# Patient Record
Sex: Female | Born: 1975 | Race: Black or African American | Hispanic: No | Marital: Married | Smoking: Never smoker
Health system: Southern US, Community
[De-identification: ages and names within clinical notes are randomized; demographics above are authoritative.]

## PROBLEM LIST (undated history)

## (undated) DIAGNOSIS — R079 Chest pain, unspecified: Secondary | ICD-10-CM

## (undated) DIAGNOSIS — E785 Hyperlipidemia, unspecified: Secondary | ICD-10-CM

## (undated) DIAGNOSIS — E669 Obesity, unspecified: Secondary | ICD-10-CM

## (undated) DIAGNOSIS — M549 Dorsalgia, unspecified: Secondary | ICD-10-CM

## (undated) DIAGNOSIS — D649 Anemia, unspecified: Secondary | ICD-10-CM

## (undated) DIAGNOSIS — I10 Essential (primary) hypertension: Secondary | ICD-10-CM

## (undated) DIAGNOSIS — K219 Gastro-esophageal reflux disease without esophagitis: Secondary | ICD-10-CM

## (undated) DIAGNOSIS — F419 Anxiety disorder, unspecified: Secondary | ICD-10-CM

## (undated) HISTORY — PX: ENDOMETRIAL ABLATION: SHX621

## (undated) HISTORY — DX: Obesity, unspecified: E66.9

## (undated) HISTORY — DX: Essential (primary) hypertension: I10

## (undated) HISTORY — DX: Dorsalgia, unspecified: M54.9

## (undated) HISTORY — DX: Chest pain, unspecified: R07.9

## (undated) HISTORY — PX: TUBAL LIGATION: SHX77

## (undated) HISTORY — PX: BARIATRIC SURGERY: SHX1103

## (undated) HISTORY — DX: Hyperlipidemia, unspecified: E78.5

## (undated) HISTORY — DX: Anxiety disorder, unspecified: F41.9

## (undated) HISTORY — PX: SKIN SURGERY: SHX2413

## (undated) HISTORY — DX: Gastro-esophageal reflux disease without esophagitis: K21.9

## (undated) HISTORY — DX: Anemia, unspecified: D64.9

---

## 2020-06-18 ENCOUNTER — Ambulatory Visit (INDEPENDENT_AMBULATORY_CARE_PROVIDER_SITE_OTHER): Payer: BC Managed Care – PPO | Admitting: Family Medicine

## 2020-06-18 ENCOUNTER — Other Ambulatory Visit: Payer: Self-pay

## 2020-06-18 ENCOUNTER — Encounter (INDEPENDENT_AMBULATORY_CARE_PROVIDER_SITE_OTHER): Payer: Self-pay | Admitting: Family Medicine

## 2020-06-18 VITALS — BP 120/82 | HR 69 | Temp 97.9°F | Ht 62.0 in | Wt 174.0 lb

## 2020-06-18 DIAGNOSIS — R0683 Snoring: Secondary | ICD-10-CM

## 2020-06-18 DIAGNOSIS — E7849 Other hyperlipidemia: Secondary | ICD-10-CM

## 2020-06-18 DIAGNOSIS — F411 Generalized anxiety disorder: Secondary | ICD-10-CM

## 2020-06-18 DIAGNOSIS — Z1331 Encounter for screening for depression: Secondary | ICD-10-CM

## 2020-06-18 DIAGNOSIS — R0602 Shortness of breath: Secondary | ICD-10-CM | POA: Diagnosis not present

## 2020-06-18 DIAGNOSIS — Z0289 Encounter for other administrative examinations: Secondary | ICD-10-CM

## 2020-06-18 DIAGNOSIS — N951 Menopausal and female climacteric states: Secondary | ICD-10-CM

## 2020-06-18 DIAGNOSIS — Z9189 Other specified personal risk factors, not elsewhere classified: Secondary | ICD-10-CM | POA: Diagnosis not present

## 2020-06-18 DIAGNOSIS — Z9884 Bariatric surgery status: Secondary | ICD-10-CM

## 2020-06-18 DIAGNOSIS — E669 Obesity, unspecified: Secondary | ICD-10-CM

## 2020-06-18 DIAGNOSIS — R7301 Impaired fasting glucose: Secondary | ICD-10-CM

## 2020-06-18 DIAGNOSIS — Z6831 Body mass index (BMI) 31.0-31.9, adult: Secondary | ICD-10-CM

## 2020-06-18 DIAGNOSIS — I10 Essential (primary) hypertension: Secondary | ICD-10-CM

## 2020-06-18 DIAGNOSIS — R5383 Other fatigue: Secondary | ICD-10-CM

## 2020-06-19 LAB — TSH: TSH: 1.16 u[IU]/mL (ref 0.450–4.500)

## 2020-06-19 LAB — COMPREHENSIVE METABOLIC PANEL
ALT: 27 IU/L (ref 0–32)
AST: 23 IU/L (ref 0–40)
Albumin/Globulin Ratio: 1.7 (ref 1.2–2.2)
Albumin: 4.8 g/dL (ref 3.8–4.8)
Alkaline Phosphatase: 80 IU/L (ref 44–121)
BUN/Creatinine Ratio: 25 — ABNORMAL HIGH (ref 9–23)
BUN: 17 mg/dL (ref 6–24)
Bilirubin Total: 0.5 mg/dL (ref 0.0–1.2)
CO2: 21 mmol/L (ref 20–29)
Calcium: 9.8 mg/dL (ref 8.7–10.2)
Chloride: 102 mmol/L (ref 96–106)
Creatinine, Ser: 0.69 mg/dL (ref 0.57–1.00)
Globulin, Total: 2.9 g/dL (ref 1.5–4.5)
Glucose: 85 mg/dL (ref 65–99)
Potassium: 4.7 mmol/L (ref 3.5–5.2)
Sodium: 141 mmol/L (ref 134–144)
Total Protein: 7.7 g/dL (ref 6.0–8.5)
eGFR: 109 mL/min/{1.73_m2} (ref >59)

## 2020-06-19 LAB — HEMOGLOBIN A1C
Est. average glucose Bld gHb Est-mCnc: 108 mg/dL
Hgb A1c MFr Bld: 5.4 % (ref 4.8–5.6)

## 2020-06-19 LAB — CBC WITH DIFFERENTIAL/PLATELET
Basophils Absolute: 0 10*3/uL (ref 0.0–0.2)
Basos: 1 %
EOS (ABSOLUTE): 0.1 10*3/uL (ref 0.0–0.4)
Eos: 3 %
Hemoglobin: 14.1 g/dL (ref 11.1–15.9)
Immature Grans (Abs): 0 10*3/uL (ref 0.0–0.1)
Immature Granulocytes: 0 %
Lymphocytes Absolute: 1.1 10*3/uL (ref 0.7–3.1)
Lymphs: 34 %
MCH: 29.1 pg (ref 26.6–33.0)
MCHC: 33.3 g/dL (ref 31.5–35.7)
MCV: 87 fL (ref 79–97)
Monocytes Absolute: 0.4 10*3/uL (ref 0.1–0.9)
Monocytes: 11 %
Neutrophils Absolute: 1.7 10*3/uL (ref 1.4–7.0)
Neutrophils: 51 %
Platelets: 278 10*3/uL (ref 150–450)
RBC: 4.84 x10E6/uL (ref 3.77–5.28)
RDW: 13.4 % (ref 11.7–15.4)
WBC: 3.2 10*3/uL — ABNORMAL LOW (ref 3.4–10.8)

## 2020-06-19 LAB — T4, FREE: Free T4: 1.03 ng/dL (ref 0.82–1.77)

## 2020-06-19 LAB — LIPID PANEL
Chol/HDL Ratio: 4.4 ratio (ref 0.0–4.4)
Cholesterol, Total: 243 mg/dL — ABNORMAL HIGH (ref 100–199)
HDL: 55 mg/dL (ref >39)
LDL Chol Calc (NIH): 166 mg/dL — ABNORMAL HIGH (ref 0–99)
Triglycerides: 124 mg/dL (ref 0–149)
VLDL Cholesterol Cal: 22 mg/dL (ref 5–40)

## 2020-06-19 LAB — ANEMIA PANEL
Ferritin: 55 ng/mL (ref 15–150)
Folate, Hemolysate: 396 ng/mL
Folate, RBC: 936 ng/mL (ref >498)
Hematocrit: 42.3 % (ref 34.0–46.6)
Iron Saturation: 64 % — ABNORMAL HIGH (ref 15–55)
Iron: 165 ug/dL — ABNORMAL HIGH (ref 27–159)
Retic Ct Pct: 1.7 % (ref 0.6–2.6)
Total Iron Binding Capacity: 259 ug/dL (ref 250–450)
UIBC: 94 ug/dL — ABNORMAL LOW (ref 131–425)
Vitamin B-12: 506 pg/mL (ref 232–1245)

## 2020-06-19 LAB — FSH/LH
FSH: 78.3 m[IU]/mL
LH: 45.9 m[IU]/mL

## 2020-06-19 LAB — INSULIN, RANDOM: INSULIN: 23.4 u[IU]/mL (ref 2.6–24.9)

## 2020-06-19 LAB — SEDIMENTATION RATE: Sed Rate: 5 mm/hr (ref 0–32)

## 2020-06-19 LAB — VITAMIN D 25 HYDROXY (VIT D DEFICIENCY, FRACTURES): Vit D, 25-Hydroxy: 17.1 ng/mL — ABNORMAL LOW (ref 30.0–100.0)

## 2020-06-19 LAB — C-REACTIVE PROTEIN: CRP: 3 mg/L (ref 0–10)

## 2020-06-26 NOTE — Progress Notes (Addendum)
Chief Complaint:   OBESITY Natasha Horn (MR# [id number]) is a 45 y.o. female who presents for evaluation and treatment of obesity and related comorbidities. Current BMI is Body mass index is 31.83 kg/m. Natasha Horn has been struggling with her weight for many years and has been unsuccessful in either losing weight, maintaining weight loss, or reaching her healthy weight goal.  Natasha Horn is currently in the action stage of change and ready to dedicate time achieving and maintaining a healthier weight. Natasha Horn is interested in becoming our patient and working on intensive lifestyle modifications including (but not limited to) diet and exercise for weight loss.  Natasha Horn says she has a big wedding in September.  History of taking Saxenda (GI issues).  History of gastric balloon and liposuction.  Her husband is on Category 3.  [name]'s habits were reviewed today and are as follows: she thinks her family will eat healthier with her, her desired weight loss is 43 pounds, she started gaining weight after having children, her heaviest weight ever was 180 pounds, she craves Gushers, cookies, meatloaf, and macaroni and cheese, she is frequently drinking liquids with calories, she frequently makes poor food choices, she has problems with excessive hunger, she frequently eats larger portions than normal and she struggles with emotional eating.  Depression Screen Natasha Horn's Food and Mood (modified PHQ-9) score was 9.  Depression screen Natasha Horn 2/9 06/18/2020  Decreased Interest 0  Down, Depressed, Hopeless 0  PHQ - 2 Score 0  Altered sleeping 3  Tired, decreased energy 3  Change in appetite 1  Feeling bad or failure about yourself  0  Trouble concentrating 2  Moving slowly or fidgety/restless 0  Suicidal thoughts 0  PHQ-9 Score 9  Difficult doing work/chores Somewhat difficult   Assessment/Plan:   1. Other fatigue [name] denies daytime somnolence and reports waking up still tired. Patent has a  history of symptoms of morning fatigue, morning headache and snoring. Natasha Horn generally gets 5 or 6 hours of sleep per night, and states that she has poor quality sleep. Snoring is present. Apneic episodes are not present. Epworth Sleepiness Score is 6.  Natasha Horn does feel that her weight is causing her energy to be lower than it should be. Fatigue may be related to obesity, depression or many other causes. Labs will be ordered, and in the meanwhile, Natasha Horn will focus on self care including making healthy food choices, increasing physical activity and focusing on stress reduction.  - EKG 12-Lead - Anemia panel - VITAMIN D 25 Hydroxy (Vit-D Deficiency, Fractures) - TSH - T4, free  2. SOB (shortness of breath) on exertion Natasha Horn notes increasing shortness of breath with exercising and seems to be worsening over time with weight gain. She notes getting out of breath sooner with activity than she used to. This has gotten worse recently. Natasha Horn denies shortness of breath at rest or orthopnea.  Natasha Horn does feel that she gets out of breath more easily that she used to when she exercises. Natasha Horn's shortness of breath appears to be obesity related and exercise induced. She has agreed to work on weight loss and gradually increase exercise to treat her exercise induced shortness of breath. Will continue to monitor closely.  3. Essential hypertension At goal. Medications: amlodipine-benazepril 10-20 mg daily, metoprolol 25 mg daily.   Plan: Avoid buying foods that are: processed, frozen, or prepackaged to avoid excess salt. We will continue to monitor closely alongside her PCP and/or Specialist.    BP Readings from Last  3 Encounters:  06/18/20 120/82   - Refill amLODipine-benazepril (LOTREL) 10-20 MG capsule; Take 1 capsule by mouth daily. - Refill metoprolol succinate (TOPROL-XL) 25 MG 24 hr tablet; Take 25 mg by mouth daily. - CBC with Differential/Platelet - Comprehensive metabolic panel -  TSH - T4, free  4. Other hyperlipidemia Course: Not at goal. Lipid-lowering medications: None.   Plan: Dietary changes: Increase soluble fiber, decrease simple carbohydrates, decrease saturated fat. Exercise changes: Moderate to vigorous-intensity aerobic activity 150 minutes per week or as tolerated. We will continue to monitor along with PCP/specialists as it pertains to her weight loss journey.  Will check FLP today.  The 10-year ASCVD risk score Natasha Horn DC Natasha Horn., et al., 2013) is: 2%   Values used to calculate the score:     Age: 28 years     Sex: Female     Is Non-Hispanic African American: Yes     Diabetic: No     Tobacco smoker: No     Systolic Blood Pressure: 678 mmHg     Is BP treated: Yes     HDL Cholesterol: 55 mg/dL     Total Cholesterol: 243 mg/dL  - Lipid panel  5. History of weight loss surgery Gastric balloon through [facility] in 2018.    6. Snores Natasha Horn endorses snoring.  She also has morning headaches.  Epworth Sleepiness Score is 6.  Plan:  Referral to Sleep Medicine placed today.  - Ambulatory referral to Sleep Studies  7. Perimenopausal vasomotor symptoms Women with obesity may experience more severe vasomotor symptoms at menopause than women with overweight or normal weight.    - FSH/LH  8. Lower extremity weakness She says that standing for several minutes causes tremors in her knees along with low back pain.  This has been occurring for years. Plan:  Will place referral to Sports Medicine today.  - C-reactive protein - Sed Rate (ESR) - Ambulatory referral to Sports Medicine  9. Fasting hyperglycemia Will check A1c and insulin level today, as per below.  - Hemoglobin A1c - Insulin, random  10. GAD (generalized anxiety disorder) Natasha Horn is taking Celexa 20 mg daily.  She eats when stressed, angry, and to stay awake.  Plan:  Continue Celexa.  Will refill today.  Behavior modification techniques were discussed today to help Natasha Horn deal  with her anxiety.    - Refill citalopram (CELEXA) 20 MG tablet; Take 20 mg by mouth daily.  11. Depression screening Shams was screened for depression as part of her new patient workup.  Her MODIFIED PHQ-9 is 9.  12. At risk for heart disease Due to Axel's current state of health and medical condition(s), she is at a higher risk for heart disease.  This puts the patient at much greater risk to subsequently develop cardiopulmonary conditions that can significantly affect patient's quality of life in a negative manner.    At least 8 minutes were spent on counseling Shar about these concerns today. Evidence-based interventions for health behavior change were utilized today including the discussion of self monitoring techniques, problem-solving barriers, and SMART goal setting techniques.  Specifically, regarding patient's less desirable eating habits and patterns, we employed the technique of small changes when Yiselle has not been able to fully commit to her prudent nutritional plan.  13. Class 1 obesity with serious comorbidity and body mass index (BMI) of 31.0 to 31.9 in adult, unspecified obesity type  Leylah is currently in the action stage of change and her goal is  to continue with weight loss efforts. I recommend Ashleyann begin the structured treatment plan as follows:  She has agreed to the Category 1 Plan.  Exercise goals: No exercise has been prescribed at this time.   Behavioral modification strategies: increasing lean protein intake, decreasing simple carbohydrates, increasing vegetables, increasing water intake, decreasing liquid calories, decreasing alcohol intake, decreasing sodium intake and increasing high fiber foods.  She was informed of the importance of frequent follow-up visits to maximize her success with intensive lifestyle modifications for her multiple health conditions. She was informed we would discuss her lab results at her next visit unless there is a critical  issue that needs to be addressed sooner. Rosell agreed to keep her next visit at the agreed upon time to discuss these results.  Objective:   Blood pressure 120/82, pulse 69, temperature 97.9 F (36.6 C), temperature source Oral, height $RemoveBefo'5\' 2"'[id number]$  (1.575 m), weight 174 lb (78.9 kg), last menstrual period 05/04/2020, SpO2 99 %. Body mass index is 31.83 kg/m.  EKG: Normal sinus rhythm, rate 70 bpm.  Indirect Calorimeter completed today shows a VO2 of 194 and a REE of 1352.  Her calculated basal metabolic rate is 9390 thus her basal metabolic rate is worse than expected.  General: Cooperative, alert, well developed, in no acute distress. HEENT: Conjunctivae and lids unremarkable. Cardiovascular: Regular rhythm.  Lungs: Normal work of breathing. Neurologic: No focal deficits.   Attestation Statements:   This is the patient's first visit at [facility]. The patient's NEW PATIENT PACKET was reviewed at length. Included in the packet: current and past health history, medications, allergies, ROS, gynecologic history (women only), surgical history, family history, social history, weight history, weight loss surgery history (for those that have had weight loss surgery), nutritional evaluation, mood and food questionnaire, PHQ9, Epworth questionnaire, sleep habits questionnaire, patient life and health improvement goals questionnaire. These will all be scanned into the patient's chart under media.   During the visit, I independently reviewed the patient's EKG, bioimpedance scale results, and indirect calorimeter results. I used this information to tailor a meal plan for the patient that will help her to lose weight and will improve her obesity-related conditions going forward. I performed a medically necessary appropriate examination and/or evaluation. I discussed the assessment and treatment plan with the patient. The patient was provided an opportunity to ask questions and all were answered.  The patient agreed with the plan and demonstrated an understanding of the instructions. Labs were ordered at this visit and will be reviewed at the next visit unless more critical results need to be addressed immediately. Clinical information was updated and documented in the EMR.   I, Water quality scientist, CMA, am acting as transcriptionist for Briscoe Deutscher, DO  I have reviewed the above documentation for accuracy and completeness, and I agree with the above. Briscoe Deutscher, DO

## 2020-07-01 NOTE — Progress Notes (Signed)
I, Peterson Lombard, LAT, ATC acting as a scribe for Lynne Leader, MD.  Subjective:    I'm seeing this patient as a consultation for Briscoe Deutscher, DO. Note will be routed back to referring provider/PCP.  CC: Low back pain  HPI: Pt is a 45 y/o females c/o chronic low back pain . Pt reports that upon standing for several minutes she get tremors in her knees and low back pain. Pt locates pain to her lower back.  This is been ongoing for years.  She has brought this to the attention of her primary care provider years ago.  Most recently she addressed this with Dr. Juleen China who obtained labs and referred her to me.  She states that her leg/knee tremors have been going on for approximately 2 years.  These tremors are worse w/ prolonged standing or walking, particularly on hard floors.  She's tried to do some knee/quad strengthening exercises w/ little to no change.  Radiating pain: No LE numbness/tingling: No Aggravating factors: housework; walking on hard floors for a prolonged period of time  Dx testing: 06/18/20   Past medical history, Surgical history, Family history, Social history, Allergies, and medications have been entered into the medical record, reviewed.   Review of Systems: No new headache, visual changes, nausea, vomiting, diarrhea, constipation, dizziness, abdominal pain, skin rash, fevers, chills, night sweats, weight loss, swollen lymph nodes, body aches, joint swelling, muscle aches, chest pain, shortness of breath, mood changes, visual or auditory hallucinations.   Objective:    Vitals:   07/02/20 1356  BP: 112/78  Pulse: 77  SpO2: 98%   General: Well Developed, well nourished, and in no acute distress.  Neuro/Psych: Alert and oriented x3, extra-ocular muscles intact, able to move all 4 extremities, sensation grossly intact. Skin: Warm and dry, no rashes noted.  Respiratory: Not using accessory muscles, speaking in full sentences, trachea midline.  Cardiovascular: Pulses  palpable, no extremity edema. Abdomen: Does not appear distended. MSK: L-spine normal. Nontender midline. Range of motion lacks full extension producing pain.  Otherwise normal range of motion. Lower extremity strength reflexes and sensation are equal normal throughout.  Bilateral  However with standing after approximately 45 seconds patient begins to have shaking of her knees.  This is improved with a bit of lumbar flexion.  Lab and Radiology Results  X-ray images L-spine obtained today personally and independently interpreted Lumbar luxation of sacrum.  Facet DJD.  Mild DDD.  Concern for spinal stenosis. Await formal radiology review  Recent Results (from the past 2160 hour(s))  Anemia panel     Status: Abnormal   Collection Time: 06/18/20 12:25 PM  Result Value Ref Range   Total Iron Binding Capacity 259 250 - 450 ug/dL   UIBC 94 (L) 131 - 425 ug/dL   Iron 165 (H) 27 - 159 ug/dL   Iron Saturation 64 (H) 15 - 55 %   Vitamin B-12 506 232 - 1,245 pg/mL   Folate, Hemolysate 396.0 Not Estab. ng/mL   Hematocrit 42.3 34.0 - 46.6 %   Folate, RBC 936 >498 ng/mL   Ferritin 55 15 - 150 ng/mL   Retic Ct Pct 1.7 0.6 - 2.6 %  CBC with Differential/Platelet     Status: Abnormal   Collection Time: 06/18/20 12:25 PM  Result Value Ref Range   WBC 3.2 (L) 3.4 - 10.8 x10E3/uL   RBC 4.84 3.77 - 5.28 x10E6/uL   Hemoglobin 14.1 11.1 - 15.9 g/dL   MCV 87 79 - 97  fL   MCH 29.1 26.6 - 33.0 pg   MCHC 33.3 31.5 - 35.7 g/dL   RDW 13.4 11.7 - 15.4 %   Platelets 278 150 - 450 x10E3/uL   Neutrophils 51 Not Estab. %   Lymphs 34 Not Estab. %   Monocytes 11 Not Estab. %   Eos 3 Not Estab. %   Basos 1 Not Estab. %   Neutrophils Absolute 1.7 1.4 - 7.0 x10E3/uL   Lymphocytes Absolute 1.1 0.7 - 3.1 x10E3/uL   Monocytes Absolute 0.4 0.1 - 0.9 x10E3/uL   EOS (ABSOLUTE) 0.1 0.0 - 0.4 x10E3/uL   Basophils Absolute 0.0 0.0 - 0.2 x10E3/uL   Immature Granulocytes 0 Not Estab. %   Immature Grans (Abs) 0.0 0.0  - 0.1 x10E3/uL  Comprehensive metabolic panel     Status: Abnormal   Collection Time: 06/18/20 12:25 PM  Result Value Ref Range   Glucose 85 65 - 99 mg/dL   BUN 17 6 - 24 mg/dL   Creatinine, Ser 0.69 0.57 - 1.00 mg/dL   eGFR 109 >59 mL/min/1.73   BUN/Creatinine Ratio 25 (H) 9 - 23   Sodium 141 134 - 144 mmol/L   Potassium 4.7 3.5 - 5.2 mmol/L   Chloride 102 96 - 106 mmol/L   CO2 21 20 - 29 mmol/L   Calcium 9.8 8.7 - 10.2 mg/dL   Total Protein 7.7 6.0 - 8.5 g/dL   Albumin 4.8 3.8 - 4.8 g/dL   Globulin, Total 2.9 1.5 - 4.5 g/dL   Albumin/Globulin Ratio 1.7 1.2 - 2.2   Bilirubin Total 0.5 0.0 - 1.2 mg/dL   Alkaline Phosphatase 80 44 - 121 IU/L   AST 23 0 - 40 IU/L   ALT 27 0 - 32 IU/L  Lipid panel     Status: Abnormal   Collection Time: 06/18/20 12:25 PM  Result Value Ref Range   Cholesterol, Total 243 (H) 100 - 199 mg/dL   Triglycerides 124 0 - 149 mg/dL   HDL 55 >39 mg/dL   VLDL Cholesterol Cal 22 5 - 40 mg/dL   LDL Chol Calc (NIH) 166 (H) 0 - 99 mg/dL   Chol/HDL Ratio 4.4 0.0 - 4.4 ratio    Comment:                                   T. Chol/HDL Ratio                                             Men  Women                               1/2 Avg.Risk  3.4    3.3                                   Avg.Risk  5.0    4.4                                2X Avg.Risk  9.6    7.1  3X Avg.Risk 23.4   11.0   Hemoglobin A1c     Status: None   Collection Time: 06/18/20 12:25 PM  Result Value Ref Range   Hgb A1c MFr Bld 5.4 4.8 - 5.6 %    Comment:          Prediabetes: 5.7 - 6.4          Diabetes: >6.4          Glycemic control for adults with diabetes: <7.0    Est. average glucose Bld gHb Est-mCnc 108 mg/dL  Insulin, random     Status: None   Collection Time: 06/18/20 12:25 PM  Result Value Ref Range   INSULIN 23.4 2.6 - 24.9 uIU/mL  VITAMIN D 25 Hydroxy (Vit-D Deficiency, Fractures)     Status: Abnormal   Collection Time: 06/18/20 12:25 PM  Result  Value Ref Range   Vit D, 25-Hydroxy 17.1 (L) 30.0 - 100.0 ng/mL    Comment: Vitamin D deficiency has been defined by the [place] and an Endocrine Society practice guideline as a level of serum 25-OH vitamin D less than 20 ng/mL (1,2). The Endocrine Society went on to further define vitamin D insufficiency as a level between 21 and 29 ng/mL (2). 1. IOM (Institute of Medicine). 2010. Dietary reference    intakes for calcium and D. [place]: The    [employer]. 2. Holick MF, Binkley Fenton, Bischoff-Ferrari HA, et al.    Evaluation, treatment, and prevention of vitamin D    deficiency: an Endocrine Society clinical practice    guideline. JCEM. 2011 Jul; 96(7):1911-30.   TSH     Status: None   Collection Time: 06/18/20 12:25 PM  Result Value Ref Range   TSH 1.160 0.450 - 4.500 uIU/mL  T4, free     Status: None   Collection Time: 06/18/20 12:25 PM  Result Value Ref Range   Free T4 1.03 0.82 - 1.77 ng/dL  FSH/LH     Status: None   Collection Time: 06/18/20 12:25 PM  Result Value Ref Range   LH 45.9 mIU/mL    Comment:                     Adult Female:                       Follicular phase      2.4 -  12.6                       Ovulation phase      14.0 -  95.6                       Luteal phase          1.0 -  11.4                       Postmenopausal        7.7 -  58.5    FSH 78.3 mIU/mL    Comment:                     Adult Female:                       Follicular phase      3.5 -  12.5  Ovulation phase       4.7 -  21.5                       Luteal phase          1.7 -   7.7                       Postmenopausal       25.8 - 134.8   Sedimentation rate     Status: None   Collection Time: 06/18/20 12:25 PM  Result Value Ref Range   Sed Rate 5 0 - 32 mm/hr  C-reactive protein     Status: None   Collection Time: 06/18/20 12:25 PM  Result Value Ref Range   CRP 3 0 - 10 mg/L     Impression and Recommendations:    Assessment  and Plan: 45 y.o. female with knee shaking with standing.  This is improved with walking which causes lumbar flexion.  I believe patient is experiencing spinal stenosis with an unusual symptom.  With her back flexed she has excellent strength however I think she is experiencing some weakness with a bit of extension.  Discussed options.  Plan for MRI to further characterize spinal stenosis pattern and lateralization.  This will help dictate physical therapy versus injection versus surgery.  PDMP not reviewed this encounter. Orders Placed This Encounter  Procedures  . DG Lumbar Spine Complete    Standing Status:   Future    Number of Occurrences:   1    Standing Expiration Date:   07/02/2021    Order Specific Question:   Reason for Exam (SYMPTOM  OR DIAGNOSIS REQUIRED)    Answer:   eval poss lumbar spinal stenosis    Order Specific Question:   Is patient pregnant?    Answer:   No    Order Specific Question:   Preferred imaging location?    Answer:   [facility]   No orders of the defined types were placed in this encounter.   Discussed warning signs or symptoms. Please see discharge instructions. Patient expresses understanding.   The above documentation has been reviewed and is accurate and complete Lynne Leader, M.D.

## 2020-07-02 ENCOUNTER — Ambulatory Visit: Payer: BC Managed Care – PPO | Admitting: Family Medicine

## 2020-07-02 ENCOUNTER — Encounter: Payer: Self-pay | Admitting: Family Medicine

## 2020-07-02 ENCOUNTER — Ambulatory Visit (INDEPENDENT_AMBULATORY_CARE_PROVIDER_SITE_OTHER): Payer: BC Managed Care – PPO

## 2020-07-02 ENCOUNTER — Other Ambulatory Visit: Payer: Self-pay

## 2020-07-02 ENCOUNTER — Ambulatory Visit (INDEPENDENT_AMBULATORY_CARE_PROVIDER_SITE_OTHER): Payer: BC Managed Care – PPO | Admitting: Family Medicine

## 2020-07-02 ENCOUNTER — Encounter (INDEPENDENT_AMBULATORY_CARE_PROVIDER_SITE_OTHER): Payer: Self-pay | Admitting: Family Medicine

## 2020-07-02 VITALS — BP 112/78 | HR 77 | Ht 62.0 in | Wt 174.8 lb

## 2020-07-02 VITALS — BP 122/77 | HR 78 | Temp 97.9°F | Ht 62.0 in | Wt 169.0 lb

## 2020-07-02 DIAGNOSIS — M5441 Lumbago with sciatica, right side: Secondary | ICD-10-CM

## 2020-07-02 DIAGNOSIS — M5442 Lumbago with sciatica, left side: Secondary | ICD-10-CM

## 2020-07-02 DIAGNOSIS — G8929 Other chronic pain: Secondary | ICD-10-CM

## 2020-07-02 DIAGNOSIS — E7800 Pure hypercholesterolemia, unspecified: Secondary | ICD-10-CM

## 2020-07-02 DIAGNOSIS — Z780 Asymptomatic menopausal state: Secondary | ICD-10-CM

## 2020-07-02 DIAGNOSIS — R29898 Other symptoms and signs involving the musculoskeletal system: Secondary | ICD-10-CM

## 2020-07-02 DIAGNOSIS — E559 Vitamin D deficiency, unspecified: Secondary | ICD-10-CM

## 2020-07-02 DIAGNOSIS — R251 Tremor, unspecified: Secondary | ICD-10-CM

## 2020-07-02 DIAGNOSIS — I10 Essential (primary) hypertension: Secondary | ICD-10-CM

## 2020-07-02 DIAGNOSIS — E8881 Metabolic syndrome: Secondary | ICD-10-CM | POA: Diagnosis not present

## 2020-07-02 DIAGNOSIS — Z9189 Other specified personal risk factors, not elsewhere classified: Secondary | ICD-10-CM | POA: Diagnosis not present

## 2020-07-02 DIAGNOSIS — E669 Obesity, unspecified: Secondary | ICD-10-CM

## 2020-07-02 DIAGNOSIS — Z6831 Body mass index (BMI) 31.0-31.9, adult: Secondary | ICD-10-CM

## 2020-07-02 MED ORDER — VITAMIN D (ERGOCALCIFEROL) 1.25 MG (50000 UNIT) PO CAPS: 50000.0000 [IU] | ORAL_CAPSULE | ORAL | 0 refills | Status: DC

## 2020-07-02 NOTE — Patient Instructions (Signed)
Thank you for coming in today.  Please get an Xray today before you leave  I will keep you updated.   Based on xray may proceed to MRI or PT or both.    R[employer] neurological surgery (7th ed., pp. [phone]). [place], [place]: Elsevier."> [employer] neurological surgery (7th ed., pp. [phone]). [place], [place]: Elsevier.">  Spinal Stenosis  Spinal stenosis is a condition that happens when the spinal canal narrows. The spinal canal is the space between the bones of your spine (vertebrae). This narrowing puts pressure on the spinal cord or nerves. Spinal stenosis can affect the vertebrae in the neck, upper back, and lower back. This condition can range from mild to severe. In some cases, there are no symptoms. What are the causes? This condition is caused by areas of bone pushing into the spinal canal. This condition may be present at birth (congenital), or it may be caused by:  Slow breakdown of your vertebrae (spinal degeneration). This usually starts around 46 years of age.  Injury (trauma) to your spine.  Tumors in your spine.  Calcium deposits in your spine. What increases the risk? The following factors may make you more likely to develop this condition:  Being older than age 74.  Having a problem present at birth with an abnormally shaped spine (congenitalspinal deformity), such as scoliosis.  Having arthritis. What are the signs or symptoms? Symptoms of this condition include:  Pain in the neck or back that is generally worse with activities, particularly when you stand or walk.  Numbness, tingling, hot or cold sensations, weakness, or tiredness (fatigue) in your leg or legs.  Pain going from the buttock, down the thigh, and to the calf (sciatica). This can happen in one or both legs.  Frequent episodes of falling.  A foot-slapping gait that leads to muscle weakness. In more severe cases, you may develop:  Problems having a bowel movement or  urinating.  Difficulty having sex.  Loss of feeling in your legs and inability to walk. Symptoms may come on slowly and get worse over time. In some cases, there are no symptoms. How is this diagnosed? This condition is diagnosed based on your medical history and a physical exam. You will also have tests, such as an MRI, a CT scan, or an X-ray. How is this treated? Treatment for this condition often focuses on managing your pain and any other symptoms. Treatment may include:  Practicing good posture to lessen pressure on your nerves.  Exercising to strengthen muscles, build endurance, improve balance, and maintain range of motion. This may include physical therapy to restore movement and strength to your back.  Losing weight, if needed.  Medicines to reduce inflammation or pain. This may include a medicine that is injected into your spine (steroidinjection).  Assistive devices, such as a corset or brace. In some cases, surgery may be needed. The most common procedure is decompression laminectomy. This is done to remove excess bone that puts pressure on your nerve roots. Follow these instructions at home: Managing pain, stiffness, and swelling  Practice good posture. If you were given a brace or a corset, wear it as told by your health care provider.  Maintain a healthy weight. Talk with your health care provider if you need help losing weight.  If directed, apply heat to the affected area as often as told by your health care provider. Use the heat source that your health care provider recommends, such as a moist heat pack or  a heating pad. ? Place a towel between your skin and the heat source. ? Leave the heat on for 20-30 minutes. ? Remove the heat if your skin turns bright red. This is especially important if you are unable to feel pain, heat, or cold. You may have a greater risk of getting burned.   Activity  Do all exercises and stretches as told by your health care  provider.  Do not do any activities that cause pain. Ask your health care provider what activities are safe for you.  Do not lift anything that is heavier than 10 lb (4.5 kg), or the limit that you are told by your health care provider.  Return to your normal activities as told by your health care provider. Ask your health care provider what activities are safe for you. General instructions  Take over-the-counter and prescription medicines only as told by your health care provider.  Do not use any products that contain nicotine or tobacco, such as cigarettes, e-cigarettes, and chewing tobacco. If you need help quitting, ask your health care provider.  Eat a healthy diet. This includes plenty of fruits and vegetables, whole grains, and low-fat (lean) protein.  Keep all follow-up visits as told by your health care provider. This is important. Contact a health care provider if:  Your symptoms do not get better or they get worse.  You have a fever. Get help right away if:  You have new pain or symptoms of severe pain, such as: ? New or worsening pain in your neck or upper back. ? Severe pain that cannot be controlled with medicines. ? A severe headache that gets worse when you stand.  You are dizzy.  You have vision problems, such as blurred vision or double vision.  You have nausea or you vomit.  You develop new or worsening numbness or tingling in your back or legs.  You have pain, redness, swelling, or warmth in your arm or leg. Summary  Spinal stenosis is a condition that happens when the spinal canal narrows. The spinal canal is the space between the bones of your spine (vertebrae). This narrowing puts pressure on the spinal cord or nerves.  This condition may be caused by a birth defect, breakdown of your vertebrae, trauma, tumors, or calcium deposits.  Spinal stenosis can cause numbness, weakness, or pain in the buttocks, neck, back, and legs.  This condition is  usually diagnosed with your medical history, a physical exam, and tests, such as an MRI, a CT scan, or an X-ray. This information is not intended to replace advice given to you by your health care provider. Make sure you discuss any questions you have with your health care provider. Document Revised: 01/12/2019 Document Reviewed: 01/12/2019 Elsevier Patient Education  2021 [employer].

## 2020-07-02 NOTE — Progress Notes (Signed)
Chief Complaint:   OBESITY Natasha Horn is here to discuss her progress with her obesity treatment plan along with follow-up of her obesity related diagnoses.   Today's visit was #: 2 Starting weight: 174 lbs Starting date: 06/18/2020 Today's weight: 169 lbs Today's date: 07/02/2020 Total lbs lost to date: 5 lbs Body mass index is 30.91 kg/m.  Total weight loss percentage to date: -3.98%  Interim History: Natasha Horn is positive for polyphagia. Her eating window is between the hours of 11 am-8 pm. She prefers not to take medications to assist with weight loss.  Current Meal Plan: the Category 1 Plan for 98% of the time.  Current Exercise Plan: None at this time.  Assessment/Plan:   1. Pure hypercholesterolemia Course: Not at goal. Lipid-lowering medications: None.   Plan: Dietary changes: Increase soluble fiber, decrease simple carbohydrates, decrease saturated fat. Exercise changes: Moderate to vigorous-intensity aerobic activity 150 minutes per week or as tolerated. We will continue to monitor along with PCP/specialists as it pertains to her weight loss journey.  Lab Results  Component Value Date   CHOL 243 (H) 06/18/2020   HDL 55 06/18/2020   LDLCALC 166 (H) 06/18/2020   TRIG 124 06/18/2020   CHOLHDL 4.4 06/18/2020   Lab Results  Component Value Date   ALT 27 06/18/2020   AST 23 06/18/2020   ALKPHOS 80 06/18/2020   BILITOT 0.5 06/18/2020   The 10-year ASCVD risk score Denman George DC Jr., et al., 2013) is: 1.5%   Values used to calculate the score:     Age: 6 years     Sex: Female     Is Non-Hispanic African American: Yes     Diabetic: No     Tobacco smoker: No     Systolic Blood Pressure: 112 mmHg     Is BP treated: Yes     HDL Cholesterol: 55 mg/dL     Total Cholesterol: 243 mg/dL  2. Insulin resistance At goal. Goal is HgbA1c < 5.7, fasting insulin closer to 5.  Medication: None.    Plan:  She will continue to focus on protein-rich, low simple carbohydrate foods.  We reviewed the importance of hydration, regular exercise for stress reduction, and restorative sleep.   Lab Results  Component Value Date   HGBA1C 5.4 06/18/2020   Lab Results  Component Value Date   INSULIN 23.4 06/18/2020    3. Vitamin D deficiency Current vitamin D is 17.1, tested on 06/18/2020. Optimal goal > 50 ng/dL.   Plan: Start to take prescription Vitamin D @50 ,000 IU every week as prescribed.  Follow-up for routine testing of Vitamin D, at least 2-3 times per year to avoid over-replacement.  - Start Vitamin D, Ergocalciferol, (DRISDOL) 1.25 MG (50000 UNIT) CAPS capsule; Take 1 capsule (50,000 Units total) by mouth every 7 (seven) days.  Dispense: 12 capsule; Refill: 0  4. Postmenopausal Women with obesity may experience more severe vasomotor symptoms at menopause than women with overweight or normal weight. FSH is 78.3, checked on 06/18/2020.  5. Essential hypertension Controlled. Medications: amlodipine-benazepril 10-20 mg daily, metoprolol 25 mg daily.   Plan: Okay to wean off Metoprolol. Avoid buying foods that are: processed, frozen, or prepackaged to avoid excess salt. We will continue to monitor closely alongside her PCP and/or Specialist.    BP Readings from Last 3 Encounters:  07/02/20 122/77  06/18/20 120/82   Lab Results  Component Value Date   CREATININE 0.69 06/18/2020   6. Weakness of lower extremity, unspecified  laterality Will be seeing Sports Medicine today.  7. At risk for heart disease Due to Naomee's current state of health and medical condition(s), she is at a higher risk for heart disease.  This puts the patient at much greater risk to subsequently develop cardiopulmonary conditions that can significantly affect patient's quality of life in a negative manner.    At least 15 minutes were spent on counseling Natasha Horn about these concerns today. Evidence-based interventions for health behavior change were utilized today including the discussion of  self monitoring techniques, problem-solving barriers, and SMART goal setting techniques.  Specifically, regarding patient's less desirable eating habits and patterns, we employed the technique of small changes when Natasha Horn has not been able to fully commit to her prudent nutritional plan.  8. Obesity, current BMI 31.1  Course: Natasha Horn is currently in the action stage of change. As such, her goal is to continue with weight loss efforts.   Nutrition goals: She has agreed to the Category 1 Plan.   Exercise goals: As tolerated  Behavioral modification strategies: increasing lean protein intake, decreasing simple carbohydrates, increasing vegetables and increasing water intake.  Natasha Horn has agreed to follow-up with our clinic in 3 weeks. She was informed of the importance of frequent follow-up visits to maximize her success with intensive lifestyle modifications for her multiple health conditions.   Objective:   Blood pressure 122/77, pulse 78, temperature 97.9 F (36.6 C), temperature source Oral, height 5\' 2"  (1.575 m), weight 169 lb (76.7 kg), SpO2 97 %. Body mass index is 30.91 kg/m.  General: Cooperative, alert, well developed, in no acute distress. HEENT: Conjunctivae and lids unremarkable. Cardiovascular: Regular rhythm.  Lungs: Normal work of breathing. Neurologic: No focal deficits.   Lab Results  Component Value Date   CREATININE 0.69 06/18/2020   BUN 17 06/18/2020   NA 141 06/18/2020   K 4.7 06/18/2020   CL 102 06/18/2020   CO2 21 06/18/2020   Lab Results  Component Value Date   ALT 27 06/18/2020   AST 23 06/18/2020   ALKPHOS 80 06/18/2020   BILITOT 0.5 06/18/2020   Lab Results  Component Value Date   HGBA1C 5.4 06/18/2020   Lab Results  Component Value Date   INSULIN 23.4 06/18/2020   Lab Results  Component Value Date   TSH 1.160 06/18/2020   Lab Results  Component Value Date   CHOL 243 (H) 06/18/2020   HDL 55 06/18/2020   LDLCALC 166 (H) 06/18/2020    TRIG 124 06/18/2020   CHOLHDL 4.4 06/18/2020   Lab Results  Component Value Date   WBC 3.2 (L) 06/18/2020   HGB 14.1 06/18/2020   HCT 42.3 06/18/2020   MCV 87 06/18/2020   PLT 278 06/18/2020   Lab Results  Component Value Date   IRON 165 (H) 06/18/2020   TIBC 259 06/18/2020   FERRITIN 55 06/18/2020   Attestation Statements:   Reviewed by clinician on day of visit: allergies, medications, problem list, medical history, surgical history, family history, social history, and previous encounter notes.  06/20/2020 Friedenbach, CMA, am acting as 09-03-1972 for Energy manager, DO.  I have reviewed the above documentation for accuracy and completeness, and I agree with the above. [employer], DO

## 2020-07-04 NOTE — Progress Notes (Signed)
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  Kidney stones are seen in both kidneys.

## 2020-07-09 ENCOUNTER — Ambulatory Visit: Payer: BC Managed Care – PPO | Admitting: Family Medicine

## 2020-07-22 ENCOUNTER — Encounter (INDEPENDENT_AMBULATORY_CARE_PROVIDER_SITE_OTHER): Payer: Self-pay | Admitting: Family Medicine

## 2020-07-25 ENCOUNTER — Institutional Professional Consult (permissible substitution): Payer: BC Managed Care – PPO | Admitting: Neurology

## 2020-08-01 ENCOUNTER — Ambulatory Visit (INDEPENDENT_AMBULATORY_CARE_PROVIDER_SITE_OTHER): Payer: BC Managed Care – PPO | Admitting: Family Medicine

## 2020-08-01 ENCOUNTER — Other Ambulatory Visit: Payer: Self-pay

## 2020-08-01 ENCOUNTER — Encounter (INDEPENDENT_AMBULATORY_CARE_PROVIDER_SITE_OTHER): Payer: Self-pay | Admitting: Family Medicine

## 2020-08-01 VITALS — BP 122/83 | HR 70 | Temp 98.1°F | Ht 62.0 in | Wt 171.0 lb

## 2020-08-01 DIAGNOSIS — E669 Obesity, unspecified: Secondary | ICD-10-CM

## 2020-08-01 DIAGNOSIS — F411 Generalized anxiety disorder: Secondary | ICD-10-CM

## 2020-08-01 DIAGNOSIS — E8881 Metabolic syndrome: Secondary | ICD-10-CM | POA: Diagnosis not present

## 2020-08-01 DIAGNOSIS — Z9189 Other specified personal risk factors, not elsewhere classified: Secondary | ICD-10-CM

## 2020-08-01 DIAGNOSIS — E559 Vitamin D deficiency, unspecified: Secondary | ICD-10-CM

## 2020-08-01 DIAGNOSIS — E88819 Insulin resistance, unspecified: Secondary | ICD-10-CM

## 2020-08-01 DIAGNOSIS — E7800 Pure hypercholesterolemia, unspecified: Secondary | ICD-10-CM | POA: Diagnosis not present

## 2020-08-01 DIAGNOSIS — Z6831 Body mass index (BMI) 31.0-31.9, adult: Secondary | ICD-10-CM

## 2020-08-01 DIAGNOSIS — I10 Essential (primary) hypertension: Secondary | ICD-10-CM

## 2020-08-01 DIAGNOSIS — R632 Polyphagia: Secondary | ICD-10-CM

## 2020-08-01 MED ORDER — CITALOPRAM HYDROBROMIDE 20 MG PO TABS: 20.0000 mg | ORAL_TABLET | Freq: Every day | ORAL | 0 refills | Status: DC

## 2020-08-01 MED ORDER — PHENTERMINE HCL 37.5 MG PO TABS: 18.7500 mg | ORAL_TABLET | Freq: Every day | ORAL | 0 refills | Status: DC

## 2020-08-01 MED ORDER — AMLODIPINE BESY-BENAZEPRIL HCL 10-20 MG PO CAPS: 1.0000 | ORAL_CAPSULE | Freq: Every day | ORAL | 0 refills | Status: DC

## 2020-08-01 NOTE — Progress Notes (Signed)
Chief Complaint:   OBESITY Natasha Horn is here to discuss her progress with her obesity treatment plan along with follow-up of her obesity related diagnoses.   Today's visit was #: 3 Starting weight: 174 lbs Starting date: 06/18/2020 Today's weight: 171 lbs Today's date: 08/01/2020 Total lbs lost to date: 3 lbs Body mass index is 31.28 kg/m.  Total weight loss percentage to date: -1.72%  Interim History:  Natasha Horn endorses polyphagia.  She has an upcoming sleep study and MRI lumbar spine.  She says she has a busy schedule and is it hard to schedule for follow-up visits.  Current Meal Plan: the Category 1 Plan for 40% of the time.  Current Exercise Plan: Increased activity.  Assessment/Plan:   1. Polyphagia Not at goal. Current treatment: None. Polyphagia refers to excessive feelings of hunger.  She declines Saxenda at the moment, but says that it helped in the past.  Plan:  She will continue to focus on protein-rich, low simple carbohydrate foods. We reviewed the importance of hydration, regular exercise for stress reduction, and restorative sleep.  After discussion, patient would like to start below medication. Expectations, risks, and potential side effects reviewed.   - Start phentermine (ADIPEX-P) 37.5 MG tablet; Take 0.5 tablets (18.75 mg total) by mouth daily before breakfast.  Dispense: 15 tablet; Refill: 0  I have consulted the [place] Controlled Substances Registry for this patient, and feel the risk/benefit ratio today is favorable for proceeding with this prescription for a controlled substance. The patient understands monitoring parameters and red flags.   2. Essential hypertension Not at goal. Medications: Lotrel 10-20 mg daily.   Plan: Avoid buying foods that are: processed, frozen, or prepackaged to avoid excess salt. We will watch for signs of hypotension as she continues lifestyle modifications.  BP Readings from Last 3 Encounters:  08/01/20 122/83  07/02/20 112/78   07/02/20 122/77   Lab Results  Component Value Date   CREATININE 0.69 06/18/2020   - Refill amLODipine-benazepril (LOTREL) 10-20 MG capsule; Take 1 capsule by mouth daily.  Dispense: 90 capsule; Refill: 0  3. Pure hypercholesterolemia Course: Not at goal. Lipid-lowering medications: None.   Plan: Dietary changes: Increase soluble fiber, decrease simple carbohydrates, decrease saturated fat. Exercise changes: Moderate to vigorous-intensity aerobic activity 150 minutes per week or as tolerated. We will continue to monitor along with PCP/specialists as it pertains to her weight loss journey.  Lab Results  Component Value Date   CHOL 243 (H) 06/18/2020   HDL 55 06/18/2020   LDLCALC 166 (H) 06/18/2020   TRIG 124 06/18/2020   CHOLHDL 4.4 06/18/2020   Lab Results  Component Value Date   ALT 27 06/18/2020   AST 23 06/18/2020   ALKPHOS 80 06/18/2020   BILITOT 0.5 06/18/2020   The 10-year ASCVD risk score Denman George DC Jr., et al., 2013) is: 2.2%   Values used to calculate the score:     Age: 45 years     Sex: Female     Is Non-Hispanic African American: Yes     Diabetic: No     Tobacco smoker: No     Systolic Blood Pressure: 122 mmHg     Is BP treated: Yes     HDL Cholesterol: 55 mg/dL     Total Cholesterol: 243 mg/dL  4. Insulin resistance Not at goal. Goal is HgbA1c < 5.7, fasting insulin closer to 5.  Medication: None.    Plan:  She will continue to focus on protein-rich, low simple  carbohydrate foods. We reviewed the importance of hydration, regular exercise for stress reduction, and restorative sleep.   Lab Results  Component Value Date   HGBA1C 5.4 06/18/2020   Lab Results  Component Value Date   INSULIN 23.4 06/18/2020   5. Vitamin D deficiency Not at goal. Current vitamin D is 17.1, tested on 06/18/2020. Optimal goal > 50 ng/dL.  She is taking vitamin D 50,000 IU weekly.  Plan: Continue to take prescription Vitamin D @50 ,000 IU every week as prescribed.  Follow-up  for routine testing of Vitamin D, at least 2-3 times per year to avoid over-replacement.  6. GAD (generalized anxiety disorder) Natasha Horn is taking Celexa 20 mg daily for anxiety.  Plan:  Will refill Celexa today, as per below.  Behavior modification techniques were discussed today to help Natasha Horn deal with her anxiety.    - Refill citalopram (CELEXA) 20 MG tablet; Take 1 tablet (20 mg total) by mouth daily.  Dispense: 90 tablet; Refill: 0  7. At risk for side effect of medication Due to [facility] starting phentermine, she is at a higher risk for drug side effect.  At least 8 minutes was spent on counseling her about these concerns today.  We discussed the benefits and potential risks of these medications, and all of patient's concerns were addressed and questions were answered.    8. Obesity, current BMI 31.4  Course: Natasha Horn is currently in the action stage of change. As such, her goal is to continue with weight loss efforts.   Nutrition goals: She has agreed to the Category 1 Plan.   Exercise goals: For substantial health benefits, adults should do at least 150 minutes (2 hours and 30 minutes) a week of moderate-intensity, or 75 minutes (1 hour and 15 minutes) a week of vigorous-intensity aerobic physical activity, or an equivalent combination of moderate- and vigorous-intensity aerobic activity. Aerobic activity should be performed in episodes of at least 10 minutes, and preferably, it should be spread throughout the week.  Behavioral modification strategies: increasing lean protein intake, decreasing simple carbohydrates, increasing vegetables and increasing water intake.  Natasha Horn has agreed to follow-up with our clinic in 4 weeks. She was informed of the importance of frequent follow-up visits to maximize her success with intensive lifestyle modifications for her multiple health conditions.   Objective:   Blood pressure 122/83, pulse 70, temperature 98.1 F (36.7 C), temperature source  Oral, height 5\' 2"  (1.575 m), weight 171 lb (77.6 kg), SpO2 95 %. Body mass index is 31.28 kg/m.  General: Cooperative, alert, well developed, in no acute distress. HEENT: Conjunctivae and lids unremarkable. Cardiovascular: Regular rhythm.  Lungs: Normal work of breathing. Neurologic: No focal deficits.   Lab Results  Component Value Date   CREATININE 0.69 06/18/2020   BUN 17 06/18/2020   NA 141 06/18/2020   K 4.7 06/18/2020   CL 102 06/18/2020   CO2 21 06/18/2020   Lab Results  Component Value Date   ALT 27 06/18/2020   AST 23 06/18/2020   ALKPHOS 80 06/18/2020   BILITOT 0.5 06/18/2020   Lab Results  Component Value Date   HGBA1C 5.4 06/18/2020   Lab Results  Component Value Date   INSULIN 23.4 06/18/2020   Lab Results  Component Value Date   TSH 1.160 06/18/2020   Lab Results  Component Value Date   CHOL 243 (H) 06/18/2020   HDL 55 06/18/2020   LDLCALC 166 (H) 06/18/2020   TRIG 124 06/18/2020   CHOLHDL 4.4 06/18/2020  Lab Results  Component Value Date   WBC 3.2 (L) 06/18/2020   HGB 14.1 06/18/2020   HCT 42.3 06/18/2020   MCV 87 06/18/2020   PLT 278 06/18/2020   Lab Results  Component Value Date   IRON 165 (H) 06/18/2020   TIBC 259 06/18/2020   FERRITIN 55 06/18/2020   Attestation Statements:   Reviewed by clinician on day of visit: allergies, medications, problem list, medical history, surgical history, family history, social history, and previous encounter notes.  I, Insurance claims handler, CMA, am acting as transcriptionist for Helane Rima, DO  I have reviewed the above documentation for accuracy and completeness, and I agree with the above. Helane Rima, DO

## 2020-08-29 ENCOUNTER — Ambulatory Visit (INDEPENDENT_AMBULATORY_CARE_PROVIDER_SITE_OTHER): Payer: BC Managed Care – PPO | Admitting: Family Medicine

## 2020-09-03 ENCOUNTER — Other Ambulatory Visit (INDEPENDENT_AMBULATORY_CARE_PROVIDER_SITE_OTHER): Payer: Self-pay | Admitting: Family Medicine

## 2020-09-03 DIAGNOSIS — R632 Polyphagia: Secondary | ICD-10-CM

## 2020-09-03 NOTE — Telephone Encounter (Signed)
Dr.Wallace °

## 2020-09-18 ENCOUNTER — Encounter (INDEPENDENT_AMBULATORY_CARE_PROVIDER_SITE_OTHER): Payer: Self-pay | Admitting: Adult Health

## 2020-09-18 ENCOUNTER — Ambulatory Visit (INDEPENDENT_AMBULATORY_CARE_PROVIDER_SITE_OTHER): Payer: BC Managed Care – PPO | Admitting: Adult Health

## 2020-09-18 ENCOUNTER — Other Ambulatory Visit: Payer: Self-pay

## 2020-09-18 VITALS — BP 100/67 | HR 81 | Temp 97.9°F | Ht 62.0 in | Wt 164.0 lb

## 2020-09-18 DIAGNOSIS — F411 Generalized anxiety disorder: Secondary | ICD-10-CM

## 2020-09-18 DIAGNOSIS — E669 Obesity, unspecified: Secondary | ICD-10-CM | POA: Diagnosis not present

## 2020-09-18 DIAGNOSIS — R632 Polyphagia: Secondary | ICD-10-CM | POA: Diagnosis not present

## 2020-09-18 DIAGNOSIS — E559 Vitamin D deficiency, unspecified: Secondary | ICD-10-CM

## 2020-09-18 DIAGNOSIS — Z6831 Body mass index (BMI) 31.0-31.9, adult: Secondary | ICD-10-CM

## 2020-09-18 NOTE — Progress Notes (Signed)
Chief Complaint:   OBESITY Natasha Horn is here to discuss her progress with her obesity treatment plan along with follow-up of her obesity related diagnoses. Natasha Horn is on the Category 1 Plan and states she is following her eating plan approximately 70% of the time. Favor states she is walking 60 minutes 5 times per week.  Today's visit was #: 4 Starting weight: 174 lbs Starting date: 06/18/2020 Today's weight: 164 lbs Today's date: 09/18/2020 Total lbs lost to date: 10 Total lbs lost since last in-office visit: 7  Interim History: Natasha Horn was started on phentermine 37.5 QAM, 1/2 tab on 08/01/2020- tolerating well. She continue to enjoy foods/structure of foods on category 1 meal plan. Limits the bread on plan due to preference.  Subjective:   1. Polyphagia PDMP reviewed- phentermine 37.5, 1/2 tab QD, Disp 15 tabs was filled 09/04/2020. [name] will experience polyphagia when not taking stimulant. Denies increase in anxiety sx's with recent addition of phentermine.  2. Vitamin D deficiency Natasha Horn's Vitamin D level was 17.1 on 06/18/2020- well below goal of 50. She is currently intermittently taking prescription vitamin D 50,000 IU each week. She denies nausea, vomiting or muscle weakness.  3. GAD (generalized anxiety disorder) Natasha Horn is on Celexa 20 mg QD- she is taking 1/2 tab. She will experience breakthrough anxiety on 10 mg.  Assessment/Plan:   1. Polyphagia Intensive lifestyle modifications are the first line treatment for this issue. We discussed several lifestyle modifications today and she will continue to work on diet, exercise and weight loss efforts. Orders and follow up as documented in patient record. Continue Phentermine as directed.  Counseling Polyphagia is excessive hunger. Causes can include: low blood sugars, hypERthyroidism, PMS, lack of sleep, stress, insulin resistance, diabetes, certain medications, and diets that are deficient in protein and fiber.    2. Vitamin D deficiency Low Vitamin D level contributes to fatigue and are associated with obesity, breast, and colon cancer. She agrees to continue to take prescription Vitamin D @50 ,000 IU every week and will follow-up for routine testing of Vitamin D, at least 2-3 times per year to avoid over-replacement. Take ergocalciferol each week.  3. GAD (generalized anxiety disorder) Behavior modification techniques were discussed today to help Natasha Horn deal with her anxiety.  Orders and follow up as documented in patient record.  Take full Celexa 10 mg dose.  4. Obesity, current BMI 30.0  [name] is currently in the action stage of change. As such, her goal is to continue with weight loss efforts. She has agreed to the Category 1 Plan.   Check fasting labs at next OV.  Exercise goals:  As is  Behavioral modification strategies: increasing lean protein intake, decreasing simple carbohydrates, meal planning and cooking strategies, keeping healthy foods in the home, and planning for success.  Natasha Horn has agreed to follow-up with our clinic in 3 weeks- fasting. She was informed of the importance of frequent follow-up visits to maximize her success with intensive lifestyle modifications for her multiple health conditions.   Objective:   Blood pressure 100/67, pulse 81, temperature 97.9 F (36.6 C), height 5\' 2"  (1.575 m), weight 164 lb (74.4 kg), SpO2 98 %. Body mass index is 30 kg/m.  General: Cooperative, alert, well developed, in no acute distress. HEENT: Conjunctivae and lids unremarkable. Cardiovascular: Regular rhythm.  Lungs: Normal work of breathing. Neurologic: No focal deficits.   Lab Results  Component Value Date   CREATININE 0.69 06/18/2020   BUN 17 06/18/2020   NA 141 06/18/2020  K 4.7 06/18/2020   CL 102 06/18/2020   CO2 21 06/18/2020   Lab Results  Component Value Date   ALT 27 06/18/2020   AST 23 06/18/2020   ALKPHOS 80 06/18/2020   BILITOT 0.5 06/18/2020    Lab Results  Component Value Date   HGBA1C 5.4 06/18/2020   Lab Results  Component Value Date   INSULIN 23.4 06/18/2020   Lab Results  Component Value Date   TSH 1.160 06/18/2020   Lab Results  Component Value Date   CHOL 243 (H) 06/18/2020   HDL 55 06/18/2020   LDLCALC 166 (H) 06/18/2020   TRIG 124 06/18/2020   CHOLHDL 4.4 06/18/2020   Lab Results  Component Value Date   WBC 3.2 (L) 06/18/2020   HGB 14.1 06/18/2020   HCT 42.3 06/18/2020   MCV 87 06/18/2020   PLT 278 06/18/2020   Lab Results  Component Value Date   IRON 165 (H) 06/18/2020   TIBC 259 06/18/2020   FERRITIN 55 06/18/2020     Attestation Statements:   Reviewed by clinician on day of visit: allergies, medications, problem list, medical history, surgical history, family history, social history, and previous encounter notes.  Time spent on visit including pre-visit chart review and post-visit care and charting was 30 minutes.   Edmund Hilda, CMA, am acting as transcriptionist for William Hamburger, NP.  I have reviewed the above documentation for accuracy and completeness, and I agree with the above. -  Betzayda Braxton d. Danyia Borunda, NP-C

## 2020-09-19 DIAGNOSIS — E669 Obesity, unspecified: Secondary | ICD-10-CM | POA: Insufficient documentation

## 2020-09-19 DIAGNOSIS — F411 Generalized anxiety disorder: Secondary | ICD-10-CM | POA: Insufficient documentation

## 2020-09-19 DIAGNOSIS — R632 Polyphagia: Secondary | ICD-10-CM | POA: Insufficient documentation

## 2020-09-19 DIAGNOSIS — E559 Vitamin D deficiency, unspecified: Secondary | ICD-10-CM | POA: Insufficient documentation

## 2020-09-25 ENCOUNTER — Encounter: Payer: Self-pay | Admitting: Family Medicine

## 2020-09-25 DIAGNOSIS — R29898 Other symptoms and signs involving the musculoskeletal system: Secondary | ICD-10-CM

## 2020-09-25 DIAGNOSIS — M5442 Lumbago with sciatica, left side: Secondary | ICD-10-CM

## 2020-10-11 ENCOUNTER — Ambulatory Visit: Payer: BC Managed Care – PPO | Admitting: Rehabilitative and Restorative Service Providers"

## 2020-10-13 ENCOUNTER — Other Ambulatory Visit (INDEPENDENT_AMBULATORY_CARE_PROVIDER_SITE_OTHER): Payer: Self-pay | Admitting: Family Medicine

## 2020-10-13 DIAGNOSIS — R632 Polyphagia: Secondary | ICD-10-CM

## 2020-10-14 ENCOUNTER — Encounter (INDEPENDENT_AMBULATORY_CARE_PROVIDER_SITE_OTHER): Payer: Self-pay

## 2020-10-14 NOTE — Telephone Encounter (Signed)
OV with Dr Earlene Plater Message sent to pt-CAS

## 2020-10-14 NOTE — Telephone Encounter (Signed)
Last OV with Katy 

## 2020-10-14 NOTE — Telephone Encounter (Signed)
Would you like to refill?  LAST APPOINTMENT DATE: 09/18/2020  NEXT APPOINTMENT DATE: 10/23/2020   [id number] - [place], [place] - [address] [place] [address] Phone: [phone] Fax: [phone]  Patient is requesting a refill of the following medications: Pending Prescriptions:                       Disp   Refills   phentermine (ADIPEX-P) 37.5 MG tablet [Pha*15 tab*0       Sig: TAKE 1/2 (ONE-HALF) TABLET BY MOUTH ONCE DAILY BEFORE          BREAKFAST   Date last filled: 09/04/20 Previously prescribed by Dr Earlene Plater  Lab Results      Component                Value               Date                      HGBA1C                   5.4                 06/18/2020           Lab Results      Component                Value               Date                      LDLCALC                  166 (H)             06/18/2020                CREATININE               0.69                06/18/2020           Lab Results      Component                Value               Date                      VD25OH                   17.1 (L)            06/18/2020            BP Readings from Last 3 Encounters: 09/18/20 : 100/67 08/01/20 : 122/83 07/02/20 : 112/78

## 2020-10-18 ENCOUNTER — Other Ambulatory Visit: Payer: Self-pay

## 2020-10-18 ENCOUNTER — Encounter: Payer: Self-pay | Admitting: Rehabilitative and Restorative Service Providers"

## 2020-10-18 ENCOUNTER — Ambulatory Visit: Payer: BC Managed Care – PPO | Admitting: Rehabilitative and Restorative Service Providers"

## 2020-10-18 ENCOUNTER — Emergency Department
Admission: EM | Admit: 2020-10-18 | Discharge: 2020-10-18 | Disposition: A | Discharge status: Home / Self Care | Payer: BC Managed Care – PPO | Source: Home / Self Care

## 2020-10-18 ENCOUNTER — Emergency Department (INDEPENDENT_AMBULATORY_CARE_PROVIDER_SITE_OTHER): Payer: BC Managed Care – PPO

## 2020-10-18 DIAGNOSIS — R29898 Other symptoms and signs involving the musculoskeletal system: Secondary | ICD-10-CM

## 2020-10-18 DIAGNOSIS — R293 Abnormal posture: Secondary | ICD-10-CM

## 2020-10-18 DIAGNOSIS — M6530 Trigger finger, unspecified finger: Secondary | ICD-10-CM

## 2020-10-18 DIAGNOSIS — M79645 Pain in left finger(s): Secondary | ICD-10-CM

## 2020-10-18 DIAGNOSIS — M79642 Pain in left hand: Secondary | ICD-10-CM | POA: Diagnosis not present

## 2020-10-18 DIAGNOSIS — M778 Other enthesopathies, not elsewhere classified: Secondary | ICD-10-CM

## 2020-10-18 DIAGNOSIS — R251 Tremor, unspecified: Secondary | ICD-10-CM

## 2020-10-18 MED ORDER — DICLOFENAC SODIUM 50 MG PO TBEC: 50.0000 mg | DELAYED_RELEASE_TABLET | Freq: Two times a day (BID) | ORAL | 0 refills | Status: DC

## 2020-10-18 NOTE — Patient Instructions (Signed)
Access Code: [email or web address] URL: [email or web address] Date: 10/18/2020 Prepared by: Corlis Leak  Exercises Prone Press Up - 2 x daily - 7 x weekly - 1 sets - 10 reps - 2-3 sec hold

## 2020-10-18 NOTE — ED Triage Notes (Signed)
PT seen in UC w/ c/o LT Middle finger and hand pain that is worsening. Pt does not recall if she injured her hand/finger but states she wakes up with her hands clenched.

## 2020-10-18 NOTE — ED Provider Notes (Signed)
[facility]    CSN: [id number] Arrival date & time: 10/18/20  1006      History   Chief Complaint Chief Complaint  Patient presents with   Hand Pain    LT hand and Middle finger    HPI Natasha Horn is a 45 y.o. female.   Patient presents with concerns of left hand and finger pain. The patient first noticed it about a week ago and it has since worsened. The patient reports the pain is mainly at the base of her left middle finger and some at the base of her ring finger into the back of her hand. She states she sleeps with her hands clenched/in fists and when she wakes up and has to stretch out her hand she has the worst pain and feels a popping sensation at the base of her middle finger. It bothers her when she bends her fingers and then pops when she straightens them from being bent. She tried wearing a finger splint during the day on her middle finger with minimal improvement. She denies any known injury or change in activity. 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 She denies prior similar. The patient denies numbness/tingling in the fingers. She is left-handed.  The history is provided by the patient.  Hand Pain This is a new problem. The current episode started more than 2 days ago.   Past Medical History:  Diagnosis Date   Anemia    Anxiety    Back pain    Chest pain    GERD (gastroesophageal reflux disease)    Hyperlipidemia    Hypertension    Obesity     Patient Active Problem List   Diagnosis Date Noted   Polyphagia 09/19/2020   Vitamin D deficiency 09/19/2020   GAD (generalized anxiety disorder) 09/19/2020   Class 1 obesity with serious comorbidity and body mass index (BMI) of 31.0 to 31.9 in adult 09/19/2020    Past Surgical History:  Procedure Laterality Date   BARIATRIC SURGERY     ENDOMETRIAL ABLATION     SKIN SURGERY     TUBAL LIGATION      OB History     Gravida  4   Para  4   Term      Preterm       AB      Living         SAB      IAB      Ectopic      Multiple      Live Births               Home Medications    Prior to Admission medications   Medication Sig Start Date End Date Taking? Authorizing Provider  diclofenac (VOLTAREN) 50 MG EC tablet Take 1 tablet (50 mg total) by mouth 2 (two) times daily. 10/18/20  Yes Shealee Yordy L, PA  amLODipine-benazepril (LOTREL) 10-20 MG capsule Take 1 capsule by mouth daily. 08/01/20   Helane Rima, DO  citalopram (CELEXA) 20 MG tablet Take 1 tablet (20 mg total) by mouth daily. 08/01/20   Helane Rima, DO  phentermine (ADIPEX-P) 37.5 MG tablet TAKE 1/2 (ONE-HALF) TABLET BY MOUTH ONCE DAILY BEFORE BREAKFAST 09/04/20   Helane Rima, DO  Vitamin D, Ergocalciferol, (DRISDOL) 1.25 MG (50000 UNIT) CAPS capsule Take 1 capsule (50,000 Units total) by mouth every 7 (seven) days. Patient not taking: Reported on 10/18/2020 07/02/20   Helane Rima,  DO    Family History History reviewed. No pertinent family history.  Social History Social History   Tobacco Use   Smoking status: Never   Smokeless tobacco: Never  Substance Use Topics   Alcohol use: Not Currently   Drug use: Never     Allergies   Patient has no known allergies.   Review of Systems Review of Systems  Constitutional:  Negative for fever.  Musculoskeletal:  Positive for arthralgias and joint swelling.  Skin:  Negative for rash and wound.  Neurological:  Negative for weakness and numbness.    Physical Exam Triage Vital Signs ED Triage Vitals  Enc Vitals Group     BP 10/18/20 1021 137/84     Pulse Rate 10/18/20 1021 72     Resp 10/18/20 1021 12     Temp 10/18/20 1021 98.2 F (36.8 C)     Temp Source 10/18/20 1021 Oral     SpO2 10/18/20 1021 97 %     Weight 10/18/20 1025 164 lb (74.4 kg)     Height 10/18/20 1025 5\' 1"  (1.549 m)     Head Circumference --      Peak Flow --      Pain Score 10/18/20 1025 4     Pain Loc --      Pain Edu? --      Excl. in GC?  --    No data found.  Updated Vital Signs BP 137/84 (BP Location: Right Arm)   Pulse 72   Temp 98.2 F (36.8 C) (Oral)   Resp 12   Ht 5\' 1"  (1.549 m)   Wt 164 lb (74.4 kg)   LMP 05/04/2020   SpO2 97%   BMI 30.99 kg/m   Visual Acuity Right Eye Distance:   Left Eye Distance:   Bilateral Distance:    Right Eye Near:   Left Eye Near:    Bilateral Near:     Physical Exam Vitals and nursing note reviewed.  Constitutional:      General: She is not in acute distress. Eyes:     Pupils: Pupils are equal, round, and reactive to light.  Cardiovascular:     Pulses: Normal pulses.  Pulmonary:     Effort: Pulmonary effort is normal.  Musculoskeletal:     Left hand: Swelling and tenderness present. Decreased range of motion. Normal sensation. Normal capillary refill. Normal pulse.     Comments: Tenderness and mild swelling to dorsal left 3rd digit, mainly over prox phalanx, MCP, and into dorsal hand overlying extensor tendon. Minimal tenderness past PIP/into distal finger. Mild tenderness to dorsal hand overlying 4th extensor tendon but no tenderness over digit. No tenderness to wrist, palmar surfaces of fingers/hand, or into remaining digits.  Patient holding fingers extending - states she can flex fully but declines to do so due to pain.   Neurological:     Mental Status: She is alert.  Psychiatric:        Mood and Affect: Mood normal.     UC Treatments / Results  Labs (all labs ordered are listed, but only abnormal results are displayed) Labs Reviewed - No data to display  EKG   Radiology DG Hand Complete Left  Result Date: 10/18/2020 CLINICAL DATA:  Left hand pain and middle finger pain EXAM: LEFT HAND - COMPLETE 3+ VIEW COMPARISON:  None. FINDINGS: No acute fracture or dislocation. No aggressive osseous lesion. Normal alignment. Soft tissue are unremarkable. No radiopaque foreign body or soft tissue emphysema. IMPRESSION:  No acute osseous injury of the left hand.  Electronically Signed   By: Elige Ko   On: 10/18/2020 11:17    Procedures Procedures (including critical care time)  Medications Ordered in UC Medications - No data to display  Initial Impression / Assessment and Plan / UC Course  I have reviewed the triage vital signs and the nursing notes.  Pertinent labs & imaging results that were available during my care of the patient were reviewed by me and considered in my medical decision making (see chart for details).     Popping sensation consistent with trigger finger, suspect some concomitant tendonitis to the 3rd extensor tendon. NSAIDs, discussed trying wearing splint at night (or wrapping with a towel or wearing thick glove) to prevent prolonged flexed position. F/u ortho/hand specialist.   E/M: 1 acute uncomplicated illness, no data, moderate risk due to prescription management  Final Clinical Impressions(s) / UC Diagnoses   Final diagnoses:  Left hand tendonitis  Trigger finger, acquired     Discharge Instructions      No fracture/break on x-ray. Take diclofenac as prescribed to help with pain and inflammation around the tendon. Try wearing splint at night, or wrapping with towel, to prevent prolonged fist position. Follow-up with ortho or hand specialist for further management.      ED Prescriptions     Medication Sig Dispense Auth. Provider   diclofenac (VOLTAREN) 50 MG EC tablet Take 1 tablet (50 mg total) by mouth 2 (two) times daily. 10 tablet Vallery Sa, Arlyce Circle L, PA      PDMP not reviewed this encounter.   Estanislado Pandy, [place] 10/18/20 1138

## 2020-10-18 NOTE — Discharge Instructions (Addendum)
No fracture/break on x-ray. Take diclofenac as prescribed to help with pain and inflammation around the tendon. Try wearing splint at night, or wrapping with towel, to prevent prolonged fist position. Follow-up with ortho or hand specialist for further management.

## 2020-10-18 NOTE — Therapy (Signed)
[facility] [place] [address] [place] [address] [place], [place], [address] Phone: [phone]   Fax:  [phone]  Physical Therapy Evaluation  Patient Details  Name: Natasha Horn MRN: [id number] Date of Birth: 1975/09/26 Referring Provider (PT): Dr Clementeen Graham   Encounter Date: 10/18/2020   PT End of Session - 10/18/20 0921     Visit Number 1    Number of Visits 12    Date for PT Re-Evaluation 11/29/20    PT Start Time 0848    Activity Tolerance Patient tolerated treatment well             Past Medical History:  Diagnosis Date   Anemia    Anxiety    Back pain    Chest pain    GERD (gastroesophageal reflux disease)    Hyperlipidemia    Hypertension    Obesity     Past Surgical History:  Procedure Laterality Date   BARIATRIC SURGERY     ENDOMETRIAL ABLATION     SKIN SURGERY     TUBAL LIGATION      There were no vitals filed for this visit.    Subjective Assessment - 10/18/20 0855     Subjective Patient reports tremors in both LE's which she feels more in her knees. She is OK wiht moving but notices tremor when she is standing still. Symptoms have been present for 2-3 yrs. and symptoms have gotten worse in the past in the past year. She notices less tremor when she is holding something with UE's. MD's are unsure of what is creating symptoms.    Pertinent History HTN; 4 vaginal deliveries with epidurals; arthritis; weight loss    Patient Stated Goals get rid of tremor    Currently in Pain? Yes    Pain Score 7     Pain Location Neck    Pain Orientation Right;Left;Posterior    Pain Descriptors / Indicators Tightness    Pain Type Chronic pain    Pain Radiating Towards into UE's at times; some HA's; sometimes LBP - no pain in LE's    Pain Onset More than a month ago    Pain Frequency Intermittent    Aggravating Factors  standing in one place    Pain Relieving Factors holding with UE's                [facility] PT  Assessment - 10/18/20 0001       Assessment   Medical Diagnosis LE tremors    Referring Provider (PT) Dr Clementeen Graham    Onset Date/Surgical Date 09/12/18   increased in the past year   Hand Dominance Left    Next MD Visit to schedule after MRI    Prior Therapy for dislocated Lt shoulder ~ 18 yrs ago      Precautions   Precautions None      Restrictions   Weight Bearing Restrictions No      Balance Screen   Has the patient fallen in the past 6 months No    Has the patient had a decrease in activity level because of a fear of falling?  No    Is the patient reluctant to leave their home because of a fear of falling?  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, hospital for a few years; CNA med sx x 6 yrs    Leisure housheold chores; gardening; cooking - up and down stairs      Observation/Other Assessments   Focus on Therapeutic Outcomes (FOTO)  51      Sensation   Additional Comments denies any numbness or tingling bilat U/LE's      Posture/Postural Control   Posture Comments head forward; shoulders rounded and elevated; slightly flexed forward at hips      Deep Tendon Reflexes   DTR Assessment Site Achilles;Patella;Brachioradialis    Brachioradialis DTR 2+    Patella DTR 2+    Achilles DTR 2+      AROM   Lumbar Flexion 80%    Lumbar Extension 50%    Lumbar - Right Side Bend 90%    Lumbar - Left Side Bend 90%    Lumbar - Right Rotation 40%    Lumbar - Left Rotation 40%      Strength   Overall Strength Comments WNL's bilat LE's      Flexibility   Hamstrings tight end range    Quadriceps WFL's    ITB WFL's    Piriformis WFL's      Palpation   Spinal mobility hypomobile lumbar spine with PA mobs    Palpation comment WFL's       Special Tests   Other special tests (-) SLR, inverted supinator test negative; hoffman sign negative; clonus testing negative; normal tone on modified ashworth screening      Ambulation/Gait   Gait Comments WFL's      High Level Balance   High Level Balance Comments Single limb stance and tandem stance provoke faster onset of muscular tremor in LEs that static standing.  Patient able to maintain SLS x 10 seconds bilaterally.                        Objective measurements completed on examination: See above findings.       [facility] Adult PT Treatment/Exercise - 10/18/20 0001       Lumbar Exercises: [employer]   Press Ups 5 reps   2-3 sec                   PT Education - 10/18/20 0931     Education Details HEP    Person(s) Educated Patient    Methods Explanation;Demonstration;Tactile cues;Verbal cues;Handout    Comprehension Verbalized understanding;Returned demonstration;Verbal cues required;Tactile cues required                 PT Long Term Goals - 10/18/20 0949       PT LONG TERM GOAL #1   Title The patient will be indep with HEP for thoracic/upper back tightness, low back flexibility, functional strength, and balance.    Time 6    Period Weeks    Target Date 11/29/20      PT LONG TERM GOAL #2   Title The patient will improve functional limitatioin score to 70    Time 6    Period Weeks    Target Date 11/29/20      PT LONG TERM GOAL #3   Title The patient will demonstrate squat to the floor and return to stand x 10 reps without LE weakness/shaking.    Time 6    Period Weeks    Target Date 11/29/20  PT LONG TERM GOAL #4   Title Independent in HEP (including aquatic program as indicated)    Time 6    Period Weeks    Status New    Target Date 11/29/20                    Plan - 10/18/20 5790     Clinical Impression Statement Patient presents with 2-3 yr history of bilat LE tremors with standing. She has good  ROM through trunk and LE's; normal strength bilat LE's; reflexes are WNL's. The patient reports upper back tightness that is constant in nature, denies falls, is negative for Hoffman, Inverted Supinator, Clonus, and Reflex testing.  The patient denies paresthesias or bowel/bladder dysfunction (besides stress incontinence).  PT to focus on HEP for functional strength, spinal flexibility to tolerance, and balance.    Personal Factors and Comorbidities Comorbidity 1;Other    Comorbidities HTN; chronic nature of symptoms    Examination-Activity Limitations Stand;Squat    Examination-Participation Restrictions Cleaning;Laundry;Other    Stability/Clinical Decision Making Evolving/Moderate complexity    Clinical Decision Making Moderate    Rehab Potential Good    PT Frequency 2x / week    PT Duration 6 weeks    PT Treatment/Interventions ADLs/Self Care Home Management;Aquatic Therapy;Cryotherapy;Electrical Stimulation;Iontophoresis 4mg /ml Dexamethasone;Moist Heat;Ultrasound;Functional mobility training;Therapeutic activities;Therapeutic exercise;Balance training;Neuromuscular re-education;Patient/family education;Passive range of motion;Dry needling;Taping    PT Next Visit Plan continue assessment for higher level balance activities; trial of balance activities and exercises; LE strengthening; additional exercises and activities as indicated    PT Home Exercise Plan [email or web address]    Consulted and Agree with Plan of Care Patient             Patient will benefit from skilled therapeutic intervention in order to improve the following deficits and impairments:  Decreased activity tolerance, Improper body mechanics, Postural dysfunction, Decreased mobility  Visit Diagnosis: Occasional tremors  Abnormal posture  Other symptoms and signs involving the musculoskeletal system     Problem List Patient Active Problem List   Diagnosis Date Noted   Polyphagia 09/19/2020   Vitamin D deficiency 09/19/2020    GAD (generalized anxiety disorder) 09/19/2020   Class 1 obesity with serious comorbidity and body mass index (BMI) of 31.0 to 31.9 in adult 09/19/2020   Evaluation performed by both PT's.   09/21/2020, MPT  Sahar Ryback Margretta Ditty PT, MPH  10/18/2020, 10:38 AM  [facility] [address] [place], [place], [place] Phone: [phone]   Fax:  [phone]  Name: Tyrika Newman MRN: Guerry Bruin Date of Birth: 04-Feb-1976

## 2020-10-23 ENCOUNTER — Other Ambulatory Visit: Payer: Self-pay

## 2020-10-23 ENCOUNTER — Ambulatory Visit (INDEPENDENT_AMBULATORY_CARE_PROVIDER_SITE_OTHER): Payer: BC Managed Care – PPO | Admitting: Family Medicine

## 2020-10-23 ENCOUNTER — Ambulatory Visit: Payer: BC Managed Care – PPO | Admitting: Rehabilitative and Restorative Service Providers"

## 2020-10-23 DIAGNOSIS — R29898 Other symptoms and signs involving the musculoskeletal system: Secondary | ICD-10-CM | POA: Diagnosis not present

## 2020-10-23 DIAGNOSIS — R251 Tremor, unspecified: Secondary | ICD-10-CM | POA: Diagnosis not present

## 2020-10-23 DIAGNOSIS — R293 Abnormal posture: Secondary | ICD-10-CM

## 2020-10-23 NOTE — Therapy (Addendum)
[address], [place], [address] Phone: [phone]   Fax:  [phone]  Physical Therapy Treatment and Discharge Summary  Patient Details  Name: Natasha Horn MRN: [id number] Date of Birth: [birth year] Referring Provider (PT): Dr Lynne Leader   Encounter Date: 10/23/2020   PT End of Session - 10/23/20 0939     Visit Number 2    Number of Visits 12    Date for PT Re-Evaluation 11/29/20    PT Start Time 0933    PT Stop Time 1012    PT Time Calculation (min) 39 min    Activity Tolerance Patient tolerated treatment well             Past Medical History:  Diagnosis Date   Anemia    Anxiety    Back pain    Chest pain    GERD (gastroesophageal reflux disease)    Hyperlipidemia    Hypertension    Obesity     Past Surgical History:  Procedure Laterality Date   BARIATRIC SURGERY     ENDOMETRIAL ABLATION     SKIN SURGERY     TUBAL LIGATION      There were no vitals filed for this visit.   Subjective Assessment - 10/23/20 0937     Subjective The pateint reports her finger was evaluated at urgent care after last visit.  She continues with shakiness in her legs with standing.    Pertinent History HTN; 4 vaginal deliveries with epidurals; arthritis; weight loss    Patient Stated Goals get rid of tremor    Currently in Pain? No/denies    Aggravating Factors  hand is irritated                [facility] PT Assessment - 10/23/20 0946       Assessment   Medical Diagnosis LE tremors    Referring Provider (PT) Dr Lynne Leader    Onset Date/Surgical Date 09/12/18    Hand Dominance Left                           [facility] Adult PT Treatment/Exercise - 10/23/20 0947       Self-Care   Self-Care Other Self-Care Comments    Other Self-Care Comments  discussed specific training for standing working on increasing time in stance/stand while watching TV      Neuro Re-ed    Neuro  Re-ed Details  STanding single leg stance, tandem stance      Exercises   Exercises Knee/Hip;Other Exercises    Other Exercises  sidelying open book thoracic spine      Knee/Hip Exercises: Standing   Heel Raises Right;Left;10 reps    Functional Squat 10 reps    Functional Squat Limitations chair squat x 10 reps    Wall Squat 10 reps    SLS 15 seconds x 3 reps with intermittent finger tip support      Knee/Hip Exercises: Prone   Other Prone Exercises quadriped opposite arm/leg balance/stability x 5 reps wiht cues to slow speed                    PT Education - 10/23/20 1004     Education Details HEP progressed    Person(s) Educated Patient    Methods Explanation;Demonstration;Handout    Comprehension Verbalized understanding;Returned demonstration  PT Long Term Goals - 10/23/20 0940       PT LONG TERM GOAL #1   Title The patient will be indep with HEP for thoracic/upper back tightness, low back flexibility, functional strength, and balance.    Time 6    Period Weeks      PT LONG TERM GOAL #2   Title The patient will improve functional limitatioin score to 70    Time 6    Period Weeks      PT LONG TERM GOAL #3   Title The patient will demonstrate squat to the floor and return to stand x 10 reps without LE weakness/shaking.    Time 6    Period Weeks      PT LONG TERM GOAL #4   Title Independent in HEP (including aquatic program as indicated)    Time 6    Period Weeks    Status New                   Plan - 10/23/20 1013     Clinical Impression Statement The patient tolerated addition of ther ex and balance activities to her HEP.  PT working on improving muscular endurance for prolonged standing and highlighting weight shift to avoid locking out at knees.  Plan to progress ther ex to tolerance.    PT Treatment/Interventions ADLs/Self Care Home Management;Aquatic Therapy;Cryotherapy;Electrical Stimulation;Iontophoresis 4mg /ml  Dexamethasone;Moist Heat;Ultrasound;Functional mobility training;Therapeutic activities;Therapeutic exercise;Balance training;Neuromuscular re-education;Patient/family education;Passive range of motion;Dry needling;Taping    PT Next Visit Plan check HEP, work on squatting and muscular endurance with ther ex    PT [place] and Agree with Plan of Care Patient             Patient will benefit from skilled therapeutic intervention in order to improve the following deficits and impairments:     Visit Diagnosis: Occasional tremors  Abnormal posture  Other symptoms and signs involving the musculoskeletal system     Problem List Patient Active Problem List   Diagnosis Date Noted   Polyphagia 09/19/2020   Vitamin D deficiency 09/19/2020   GAD (generalized anxiety disorder) 09/19/2020   Class 1 obesity with serious comorbidity and body mass index (BMI) of 31.0 to 31.9 in adult 09/19/2020    [place], PT 10/23/2020, 10:16 AM  [facility] [place] [place], [place], [address] Phone: [phone]   Fax:  [phone]  Name: Natasha Horn MRN: [id number] Date of Birth: [birth year]  PHYSICAL THERAPY DISCHARGE SUMMARY  Visits from Start of Care: 2  Current functional level related to goals / functional outcomes: See progress note for discharge status    Remaining deficits: Unchanged   Education / Equipment: HEP    Patient agrees to discharge. Patient goals were not met. Patient is being discharged due to not returning since the last visit.  Celyn P. Helene Kelp PT, MPH 12/05/20 12:34 PM

## 2020-10-23 NOTE — Patient Instructions (Signed)
Access Code: [email or web address] URL: [email or web address] Date: 10/23/2020 Prepared by: Margretta Ditty  Exercises Prone Press Up - 1 x daily - 7 x weekly - 1 sets - 10 reps - 2-3 sec hold Supine Lower Trunk Rotation - 1 x daily - 7 x weekly - 1 sets - 5 reps - 20 seconds hold Supine Thoracic Mobilization Towel Roll Vertical with Arm Stretch - 1 x daily - 7 x weekly - 1 sets - 1 reps - 2 minutes hold Squat with Chair Touch - 1 x daily - 7 x weekly - 1 sets - 10 reps Wall Quarter Squat - 1 x daily - 7 x weekly - 1 sets - 5-10 reps - 3-5 seconds hold Standing Single Leg Heel Raise - 1 x daily - 7 x weekly - 1 sets - 5-10 reps Tandem Stance with Chair Support - 2 x daily - 7 x weekly - 1 sets - 10 reps

## 2020-10-24 ENCOUNTER — Encounter (INDEPENDENT_AMBULATORY_CARE_PROVIDER_SITE_OTHER): Payer: Self-pay | Admitting: Family Medicine

## 2020-10-24 DIAGNOSIS — R632 Polyphagia: Secondary | ICD-10-CM

## 2020-11-06 ENCOUNTER — Encounter (INDEPENDENT_AMBULATORY_CARE_PROVIDER_SITE_OTHER): Payer: Self-pay | Admitting: Family Medicine

## 2020-11-06 ENCOUNTER — Telehealth (INDEPENDENT_AMBULATORY_CARE_PROVIDER_SITE_OTHER): Payer: Self-pay | Admitting: Emergency Medicine

## 2020-11-06 ENCOUNTER — Other Ambulatory Visit: Payer: Self-pay

## 2020-11-06 ENCOUNTER — Ambulatory Visit (INDEPENDENT_AMBULATORY_CARE_PROVIDER_SITE_OTHER): Payer: BC Managed Care – PPO | Admitting: Family Medicine

## 2020-11-06 VITALS — BP 127/80 | HR 82 | Temp 98.2°F | Ht 61.0 in | Wt 164.0 lb

## 2020-11-06 DIAGNOSIS — R632 Polyphagia: Secondary | ICD-10-CM

## 2020-11-06 DIAGNOSIS — I10 Essential (primary) hypertension: Secondary | ICD-10-CM

## 2020-11-06 DIAGNOSIS — E669 Obesity, unspecified: Secondary | ICD-10-CM

## 2020-11-06 DIAGNOSIS — Z6831 Body mass index (BMI) 31.0-31.9, adult: Secondary | ICD-10-CM

## 2020-11-06 DIAGNOSIS — Z9189 Other specified personal risk factors, not elsewhere classified: Secondary | ICD-10-CM | POA: Diagnosis not present

## 2020-11-06 DIAGNOSIS — E8881 Metabolic syndrome: Secondary | ICD-10-CM | POA: Diagnosis not present

## 2020-11-06 DIAGNOSIS — E559 Vitamin D deficiency, unspecified: Secondary | ICD-10-CM | POA: Diagnosis not present

## 2020-11-06 DIAGNOSIS — F39 Unspecified mood [affective] disorder: Secondary | ICD-10-CM

## 2020-11-06 MED ORDER — CITALOPRAM HYDROBROMIDE 20 MG PO TABS: 20.0000 mg | ORAL_TABLET | Freq: Every day | ORAL | 0 refills | Status: DC

## 2020-11-06 MED ORDER — VITAMIN D (ERGOCALCIFEROL) 1.25 MG (50000 UNIT) PO CAPS: 50000.0000 [IU] | ORAL_CAPSULE | ORAL | 0 refills | Status: DC

## 2020-11-06 MED ORDER — AMLODIPINE BESY-BENAZEPRIL HCL 10-20 MG PO CAPS: 1.0000 | ORAL_CAPSULE | Freq: Every day | ORAL | 0 refills | Status: DC

## 2020-11-06 MED ORDER — PHENTERMINE HCL 37.5 MG PO TABS: ORAL_TABLET | ORAL | 0 refills | Status: DC

## 2020-11-06 NOTE — Telephone Encounter (Signed)
No action needed

## 2020-11-06 NOTE — Progress Notes (Signed)
Chief Complaint:   OBESITY Natasha Horn is here to discuss her progress with her obesity treatment plan along with follow-up of her obesity related diagnoses. Natasha Horn is on the Category 1 Plan and states she is following her eating plan approximately 0% of the time. Natasha Horn states she is walking for 60 minutes 4-5 times per week.  Today's visit was #: 5 Starting weight: 174 lbs Starting date: 06/18/2020 Today's weight: 164 lbs Today's date: 11/06/2020 Total lbs lost to date: 10 Total lbs lost since last in-office visit: 0  Interim History: Natasha Horn is a patient of Dr. Earlene Plater. Her last follow-up was several weeks ago. Natasha Horn is here for a follow up office visit.  We reviewed her meal plan and questions were answered.  Patient's food recall appears to be accurate and consistent with what is on plan when she is following it.   When eating on plan, her hunger and cravings are well controlled.    Subjective:   1. Polyphagia [name] endorses excessive hunger.   2. Insulin resistance Natasha Horn has never bee on metformin.  3. Essential hypertension Natasha Horn's home blood pressure is 122/68, and her number and heart rate are within normal limits. She is taking Lotrel.  4. Vitamin D deficiency Natasha Horn is currently taking prescription Vitamin D 50,000 IU weekly. Sh denies nausea, vomiting, or muscle weakness.  5. Mood disorder (HCC) with emotional eating Natasha Horn has been off Celexa for 1 week now.  6. At risk for malnutrition Natasha Horn is at increased risk for malnutrition due to inadequate food intake some days.  Assessment/Plan:   1. Polyphagia Intensive lifestyle modifications are the first line treatment for this issue. We discussed several lifestyle modifications today. We will refill phentermine for 30 days. Tiki will return to our clinic in 3 weeks, and she understands the refill policy. she will continue to work on diet, exercise and weight loss efforts. Orders and follow up  as documented in patient record.  Counseling Polyphagia is excessive hunger. Causes can include: low blood sugars, hypERthyroidism, PMS, lack of sleep, stress, insulin resistance, diabetes, certain medications, and diets that are deficient in protein and fiber.   - phentermine (ADIPEX-P) 37.5 MG tablet; TAKE 1/2 (ONE-HALF) TABLET BY MOUTH ONCE DAILY BEFORE BREAKFAST  Dispense: 15 tablet; Refill: 0  2. Insulin resistance Natasha Horn will continue to work on weight loss, exercise, and decreasing simple carbohydrates to help decrease the risk of diabetes. Natasha Horn agreed to follow-up with [place] as directed to closely monitor her progress.  3. Essential hypertension Natasha Horn will continue working on healthy weight loss and exercise to improve blood pressure control. We will refill Lotrel for 1 month, and will watch for signs of hypotension as she continues her lifestyle modifications.  - amLODipine-benazepril (LOTREL) 10-20 MG capsule; Take 1 capsule by mouth daily.  Dispense: 30 capsule; Refill: 0  4. Vitamin D deficiency Low Vitamin D level contributes to fatigue and are associated with obesity, breast, and colon cancer. We will refill prescription Vitamin D for 1 month. Natasha Horn will follow-up for routine testing of Vitamin D, at least 2-3 times per year to avoid over-replacement.  - Vitamin D, Ergocalciferol, (DRISDOL) 1.25 MG (50000 UNIT) CAPS capsule; Take 1 capsule (50,000 Units total) by mouth every 7 (seven) days.  Dispense: 4 capsule; Refill: 0  5. Mood disorder (HCC) with emotional eating Behavior modification techniques were discussed today to help Natasha Horn deal with her emotional/non-hunger eating behaviors. We will refill Celexa for 1 month. Orders and follow up  as documented in patient record.   - citalopram (CELEXA) 20 MG tablet; Take 1 tablet (20 mg total) by mouth daily.  Dispense: 30 tablet; Refill: 0  6. At risk for malnutrition Natasha Horn was given approximately 22 minutes of counseling  today regarding prevention of malnutrition and ways to meet macronutrient goals.    7. Obesity with current BMI 31.0 Natasha Horn is currently in the action stage of change. As such, her goal is to continue with weight loss efforts. She has agreed to the Category 1 Plan.   Call for GYN appointment with Dr. Hyacinth Meeker. I also gave the patient a handout on Primary care providers as she needs to get established.  Exercise goals: As is.  Behavioral modification strategies: increasing lean protein intake, decreasing simple carbohydrates, no skipping meals, and meal planning and cooking strategies.  Natasha Horn has agreed to follow-up with our clinic in 3 weeks with Dr. Earlene Plater. She was informed of the importance of frequent follow-up visits to maximize her success with intensive lifestyle modifications for her multiple health conditions.   Objective:   Blood pressure 127/80, pulse 82, temperature 98.2 F (36.8 C), height 5\' 1"  (1.549 m), weight 164 lb (74.4 kg), last menstrual period 05/04/2020, SpO2 98 %. Body mass index is 30.99 kg/m.  General: Cooperative, alert, well developed, in no acute distress. HEENT: Conjunctivae and lids unremarkable. Cardiovascular: Regular rhythm.  Lungs: Normal work of breathing. Neurologic: No focal deficits.   Lab Results  Component Value Date   CREATININE 0.69 06/18/2020   BUN 17 06/18/2020   NA 141 06/18/2020   K 4.7 06/18/2020   CL 102 06/18/2020   CO2 21 06/18/2020   Lab Results  Component Value Date   ALT 27 06/18/2020   AST 23 06/18/2020   ALKPHOS 80 06/18/2020   BILITOT 0.5 06/18/2020   Lab Results  Component Value Date   HGBA1C 5.4 06/18/2020   Lab Results  Component Value Date   INSULIN 23.4 06/18/2020   Lab Results  Component Value Date   TSH 1.160 06/18/2020   Lab Results  Component Value Date   CHOL 243 (H) 06/18/2020   HDL 55 06/18/2020   LDLCALC 166 (H) 06/18/2020   TRIG 124 06/18/2020   CHOLHDL 4.4 06/18/2020   Lab Results   Component Value Date   VD25OH 17.1 (L) 06/18/2020   Lab Results  Component Value Date   WBC 3.2 (L) 06/18/2020   HGB 14.1 06/18/2020   HCT 42.3 06/18/2020   MCV 87 06/18/2020   PLT 278 06/18/2020   Lab Results  Component Value Date   IRON 165 (H) 06/18/2020   TIBC 259 06/18/2020   FERRITIN 55 06/18/2020   Attestation Statements:   Reviewed by clinician on day of visit: allergies, medications, problem list, medical history, surgical history, family history, social history, and previous encounter notes.   06/20/2020, am acting as transcriptionist for Trude Mcburney, DO.  I have reviewed the above documentation for accuracy and completeness, and I agree with the above. [employer], D.O.  The 21st Century Cures Act was signed into law in 2016 which includes the topic of electronic health records.  This provides immediate access to information in MyChart.  This includes consultation notes, operative notes, office notes, lab results and pathology reports.  If you have any questions about what you read please let 2017 know at your next visit so we can discuss your concerns and take corrective action if need be.  We  are right here with you.

## 2020-11-14 MED ORDER — SAXENDA 18 MG/3ML ~~LOC~~ SOPN: 3.0000 mg | PEN_INJECTOR | Freq: Every day | SUBCUTANEOUS | 0 refills | Status: DC

## 2020-11-18 ENCOUNTER — Encounter (INDEPENDENT_AMBULATORY_CARE_PROVIDER_SITE_OTHER): Payer: Self-pay

## 2020-12-23 ENCOUNTER — Other Ambulatory Visit: Payer: Self-pay

## 2020-12-23 ENCOUNTER — Ambulatory Visit (INDEPENDENT_AMBULATORY_CARE_PROVIDER_SITE_OTHER): Payer: BC Managed Care – PPO | Admitting: Family Medicine

## 2020-12-23 ENCOUNTER — Encounter (INDEPENDENT_AMBULATORY_CARE_PROVIDER_SITE_OTHER): Payer: Self-pay | Admitting: Family Medicine

## 2020-12-23 VITALS — BP 115/76 | HR 85 | Temp 98.1°F | Ht 61.0 in | Wt 168.0 lb

## 2020-12-23 DIAGNOSIS — Z9189 Other specified personal risk factors, not elsewhere classified: Secondary | ICD-10-CM

## 2020-12-23 DIAGNOSIS — E669 Obesity, unspecified: Secondary | ICD-10-CM

## 2020-12-23 DIAGNOSIS — E559 Vitamin D deficiency, unspecified: Secondary | ICD-10-CM

## 2020-12-23 DIAGNOSIS — I10 Essential (primary) hypertension: Secondary | ICD-10-CM

## 2020-12-23 DIAGNOSIS — M65332 Trigger finger, left middle finger: Secondary | ICD-10-CM | POA: Diagnosis not present

## 2020-12-23 DIAGNOSIS — R632 Polyphagia: Secondary | ICD-10-CM

## 2020-12-23 DIAGNOSIS — F39 Unspecified mood [affective] disorder: Secondary | ICD-10-CM

## 2020-12-23 DIAGNOSIS — Z6831 Body mass index (BMI) 31.0-31.9, adult: Secondary | ICD-10-CM

## 2020-12-23 DIAGNOSIS — M778 Other enthesopathies, not elsewhere classified: Secondary | ICD-10-CM

## 2020-12-23 MED ORDER — SAXENDA 18 MG/3ML ~~LOC~~ SOPN: 3.0000 mg | PEN_INJECTOR | Freq: Every day | SUBCUTANEOUS | 3 refills | Status: DC

## 2020-12-23 MED ORDER — AMLODIPINE BESY-BENAZEPRIL HCL 10-20 MG PO CAPS: 1.0000 | ORAL_CAPSULE | Freq: Every day | ORAL | 3 refills | Status: AC

## 2020-12-23 MED ORDER — CITALOPRAM HYDROBROMIDE 20 MG PO TABS: 20.0000 mg | ORAL_TABLET | Freq: Every day | ORAL | 3 refills | Status: AC

## 2020-12-23 MED ORDER — PHENTERMINE HCL 37.5 MG PO TABS: ORAL_TABLET | ORAL | 0 refills | Status: AC

## 2020-12-23 MED ORDER — VITAMIN D (ERGOCALCIFEROL) 1.25 MG (50000 UNIT) PO CAPS
50000.0000 [IU] | ORAL_CAPSULE | ORAL | 0 refills | Status: AC
Start: 2020-12-23 — End: ?

## 2020-12-23 MED ORDER — DICLOFENAC SODIUM 50 MG PO TBEC: 50.0000 mg | DELAYED_RELEASE_TABLET | Freq: Two times a day (BID) | ORAL | 0 refills | Status: AC

## 2020-12-23 NOTE — Progress Notes (Signed)
Chief Complaint:   OBESITY Natasha Horn is here to discuss her progress with her obesity treatment plan along with follow-up of her obesity related diagnoses.   Today's visit was #: 6 Starting weight: 174 lbs Starting date: 06/18/2020 Today's weight: 168 lbs Today's date: 12/23/2020 Weight change since last visit: +4 lbs Total lbs lost to date: 6 lbs Body mass index is 31.74 kg/m.  Total weight loss percentage to date: -3.45%  Current Meal Plan: the Category 1 Plan for 0% of the time.  Current Exercise Plan: Increased activity. Current Anti-Obesity Medications: phentermine 18.75 mg daily and Saxenda 3 mg daily. Side effects: None.  Interim History:  Since her last visit, Natasha Horn has had her wedding/honeymoon.  She says she ran out of Celexa.  She decided to quit her current job since she was officered a new position.  Assessment/Plan:   1. Trigger middle finger of left hand Will refer back to Sports Medicine.  She previously saw Dr. Denyse Amass for a different concern.  - Ambulatory referral to Sports Medicine  2. Essential hypertension At goal. Medications: Lotrel 10-20 mg daily.   Plan: Continue Lotrel.  Will refill today, as per below.  Avoid buying foods that are: processed, frozen, or prepackaged to avoid excess salt. We will watch for signs of hypotension as she continues lifestyle modifications.  BP Readings from Last 3 Encounters:  12/23/20 115/76  11/06/20 127/80  10/18/20 137/84   Lab Results  Component Value Date   CREATININE 0.69 06/18/2020   - Refill amLODipine-benazepril (LOTREL) 10-20 MG capsule; Take 1 capsule by mouth daily.  Dispense: 90 capsule; Refill: 3  3. Polyphagia Controlled. Current treatment: Saxenda 3 mg subcutaneously daily and phentermine 18.75 mg daily. She will continue to focus on protein-rich, low simple carbohydrate foods. We reviewed the importance of hydration, regular exercise for stress reduction, and restorative sleep.  Plan:   Continue Saxenda and phentermine at current doses.  Will refill today.  - Refill Liraglutide -Weight Management (SAXENDA) 18 MG/3ML SOPN; Inject 3 mg into the skin daily.  Dispense: 15 mL; Refill: 3 - Refill phentermine (ADIPEX-P) 37.5 MG tablet; TAKE 1/2 (ONE-HALF) TABLET BY MOUTH ONCE DAILY BEFORE BREAKFAST  Dispense: 15 tablet; Refill: 0  Having again reminded the patient of the "off label" use of Phentermine beyond three consecutive months, and again discussing the risks, benefits, contraindications, and limitations of it's use; given it's role in the successful treatment of obesity thus far and lack of adverse effect, patient has expressed desire and given informed verbal consent to continue use.   I have consulted the [place] Controlled Substances Registry for this patient, and feel the risk/benefit ratio today is favorable for proceeding with this prescription for a controlled substance. The patient understands monitoring parameters and red flags.   4. Vitamin D deficiency Not at goal.  She is taking vitamin D 50,000 IU weekly.  Plan: Continue to take prescription Vitamin D @50 ,000 IU every week as prescribed.  Follow-up for routine testing of Vitamin D, at least 2-3 times per year to avoid over-replacement.  Lab Results  Component Value Date   VD25OH 17.1 (L) 06/18/2020   - Refill Vitamin D, Ergocalciferol, (DRISDOL) 1.25 MG (50000 UNIT) CAPS capsule; Take 1 capsule (50,000 Units total) by mouth every 7 (seven) days.  Dispense: 4 capsule; Refill: 0  5. Left hand tendonitis Start diclofenac 50 mg twice daily for pain.  - Start diclofenac (VOLTAREN) 50 MG EC tablet; Take 1 tablet (50 mg total) by  mouth 2 (two) times daily.  Dispense: 60 tablet; Refill: 0  6. Mood disorder (HCC) with emotional eating Stable.  Medication: Celexa 20 mg daily.  Plan:  Discussed cues and consequences, how thoughts affect eating, model of thoughts, feelings, and behaviors, and strategies for change by focusing  on the cue. Discussed cognitive distortions, coping thoughts, and how to change your thoughts.  - Refill citalopram (CELEXA) 20 MG tablet; Take 1 tablet (20 mg total) by mouth daily.  Dispense: 90 tablet; Refill: 3  7. At risk for heart disease Due to Retia's current state of health and medical condition(s), she is at a higher risk for heart disease.  This puts the patient at much greater risk to subsequently develop cardiopulmonary conditions that can significantly affect patient's quality of life in a negative manner.    At least 8 minutes were spent on counseling Natasha Horn about these concerns today. Evidence-based interventions for health behavior change were utilized today including the discussion of self monitoring techniques, problem-solving barriers, and SMART goal setting techniques.  Specifically, regarding patient's less desirable eating habits and patterns, we employed the technique of small changes when Natasha Horn has not been able to fully commit to her prudent nutritional plan.  8. Obesity with current BMI 31.9  Course: Myangel is currently in the action stage of change. As such, her goal is to continue with weight loss efforts.   Nutrition goals: She has agreed to the Category 1 Plan.   Exercise goals:  As is.  Behavioral modification strategies: increasing lean protein intake, decreasing simple carbohydrates, increasing vegetables, and increasing water intake.  [place] has agreed to follow-up with our clinic in 6 weeks. She was informed of the importance of frequent follow-up visits to maximize her success with intensive lifestyle modifications for her multiple health conditions.   Objective:   Blood pressure 115/76, pulse 85, temperature 98.1 F (36.7 C), temperature source Oral, height 5\' 1"  (1.549 m), weight 168 lb (76.2 kg), last menstrual period 05/04/2020, SpO2 95 %. Body mass index is 31.74 kg/m.  General: Cooperative, alert, well developed, in no acute  distress. HEENT: Conjunctivae and lids unremarkable. Cardiovascular: Regular rhythm.  Lungs: Normal work of breathing. Neurologic: No focal deficits.   Lab Results  Component Value Date   CREATININE 0.69 06/18/2020   BUN 17 06/18/2020   NA 141 06/18/2020   K 4.7 06/18/2020   CL 102 06/18/2020   CO2 21 06/18/2020   Lab Results  Component Value Date   ALT 27 06/18/2020   AST 23 06/18/2020   ALKPHOS 80 06/18/2020   BILITOT 0.5 06/18/2020   Lab Results  Component Value Date   HGBA1C 5.4 06/18/2020   Lab Results  Component Value Date   INSULIN 23.4 06/18/2020   Lab Results  Component Value Date   TSH 1.160 06/18/2020   Lab Results  Component Value Date   CHOL 243 (H) 06/18/2020   HDL 55 06/18/2020   LDLCALC 166 (H) 06/18/2020   TRIG 124 06/18/2020   CHOLHDL 4.4 06/18/2020   Lab Results  Component Value Date   VD25OH 17.1 (L) 06/18/2020   Lab Results  Component Value Date   WBC 3.2 (L) 06/18/2020   HGB 14.1 06/18/2020   HCT 42.3 06/18/2020   MCV 87 06/18/2020   PLT 278 06/18/2020   Lab Results  Component Value Date   IRON 165 (H) 06/18/2020   TIBC 259 06/18/2020   FERRITIN 55 06/18/2020   Attestation Statements:   Reviewed by clinician  on day of visit: allergies, medications, problem list, medical history, surgical history, family history, social history, and previous encounter notes.  I, Insurance claims handler, CMA, am acting as transcriptionist for Helane Rima, DO  I have reviewed the above documentation for accuracy and completeness, and I agree with the above. Helane Rima, DO

## 2020-12-26 ENCOUNTER — Ambulatory Visit: Payer: BC Managed Care – PPO | Admitting: Family Medicine

## 2020-12-26 NOTE — Progress Notes (Deleted)
   I, Philbert Riser, LAT, ATC acting as a scribe for Clementeen Graham, MD.  Subjective:    CC: L 3rd finger pain  HPI: Pt is a 45 y/o female c/o L 3rd finger pain and triggering. Pt was previously seen by Dr. 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 Today, pt reports L 3rd finger pain ongoing for /. Pt locates pain to L 3rd finger, PIP?  Grip strength: Aggravates: Treatments tried:  Dx imaging: 10/18/20 L hand XR  Pertinent review of Systems: ***  Relevant historical information: ***   Objective:   There were no vitals filed for this visit. General: Well Developed, well nourished, and in no acute distress.   MSK: ***  Lab and Radiology Results No results found for this or any previous visit (from the past 72 hour(s)). No results found.    Impression and Recommendations:    Assessment and Plan: 45 y.o. female with ***.  PDMP not reviewed this encounter. No orders of the defined types were placed in this encounter.  No orders of the defined types were placed in this encounter.   Discussed warning signs or symptoms. Please see discharge instructions. Patient expresses understanding.   ***

## 2021-02-03 ENCOUNTER — Encounter (INDEPENDENT_AMBULATORY_CARE_PROVIDER_SITE_OTHER): Payer: Self-pay

## 2021-02-03 ENCOUNTER — Ambulatory Visit (INDEPENDENT_AMBULATORY_CARE_PROVIDER_SITE_OTHER): Payer: BC Managed Care – PPO | Admitting: Family Medicine

## 2021-02-28 ENCOUNTER — Ambulatory Visit: Payer: BC Managed Care – PPO | Admitting: Family Medicine

## 2021-03-03 NOTE — Progress Notes (Signed)
I, Wendy Poet, LAT, ATC, am serving as scribe for Dr. Lynne Leader.  Natasha Horn is a 45 y.o. female who presents to [place] at [facility] today for B hand and feet pain.  Pt is L-hand dominate. She was last seen by Dr. Georgina Snell on 07/02/20 for chronic LBP and was ultimately referred to PT of which she completed 2 sessions.  Today, pt reports R hand pain x 1 month, L hand has been hurting for awhile. Pt c/o L 3rd finger triggering that will radiating up her forearm. Pt c/o pain in all of the fingers of the L hand within the joints. Pt does a lot of computer work typing. Pt c/o pain esp in R thumb and the same stiffness in all the joints of her R fingers.  Aggravates: worse in the mornings Treatments tried: diclofenac tab, IBU  Pt c/o bilat feet pain ongoing for about 1 month. Pt locates pain within the joints of her toes. Pt notes she is wearing boots a lot more for her job.  She is currently in between health insurance.  Her new health insurance starts December 15.  Diagnostic imaging: L hand XR- 10/18/20  Pertinent review of systems: No fevers or chills.  Positive for general myalgias and arthralgias.  Relevant historical information: No family history of rheumatologic disorders.   Exam:  BP 118/80   Pulse 98   Ht $R'5\' 1"'Cj$  (1.549 m)   Wt 180 lb 12.8 oz (82 kg)   LMP 05/04/2020   SpO2 97%   BMI 34.16 kg/m  General: Well Developed, well nourished, and in no acute distress.   MSK: Right hand no severe synovitis or joint effusions.  No ulnar deviation. PIPs mildly tender palpation. Normal hand motion.  Normal strength.  Left hand: Normal-appearing with no severe synovitis or significant joint swelling. No ulnar deviation. Tender palpation PIPs. Tender palpation palmar third MCP. Triggering present with flexion of third PIP.  Feet normal-appearing bilaterally.  Tender palpation across PIPs bilaterally.    Lab and Radiology Results   Bilateral hand x-ray  and labs are pending.  Will be completed after Dec 15    Assessment and Plan: 45 y.o. female with left third digit trigger finger.  This has been ongoing for months.  Plan to treat with immobilization with double Band-Aid splint and Voltaren gel.  Injection would be helpful but patient would like to avoid injection for now.  Additionally patient has diffuse arthralgias and concern for rheumatologic disorder.  Plan for rheumatologic work-up with hand x-ray and lab assessment..  This work-up will be completed on December 15 or later.  Additionally will refer to physical therapy as her diffuse hand pain may benefit from hand PT.  Recheck in about 6 weeks.   PDMP not reviewed this encounter. Orders Placed This Encounter  Procedures   DG Hand Complete Right    Standing Status:   Future    Number of Occurrences:   1    Standing Expiration Date:   03/04/2022    Order Specific Question:   Reason for Exam (SYMPTOM  OR DIAGNOSIS REQUIRED)    Answer:   eval hand pain. ?Rheum    Order Specific Question:   Is patient pregnant?    Answer:   No    Order Specific Question:   Preferred imaging location?    Answer:   [facility]   Sedimentation rate    Standing Status:   Future    Standing Expiration  Date:   52/03/7469   Cyclic citrul peptide antibody, IgG    Standing Status:   Future    Standing Expiration Date:   03/04/2022   ANA    Standing Status:   Future    Standing Expiration Date:   03/04/2022   Rheumatoid factor    Standing Status:   Future    Standing Expiration Date:   03/04/2022   CK    Standing Status:   Future    Standing Expiration Date:   03/04/2022   Complement, total    Standing Status:   Future    Standing Expiration Date:   03/04/2022   HLA-B27 antigen    Standing Status:   Future    Standing Expiration Date:   03/04/2022   Ambulatory referral to Physical Therapy    Referral Priority:   Routine    Referral Type:   Physical Medicine    Referral Reason:   Specialty  Services Required    Requested Specialty:   Physical Therapy    Number of Visits Requested:   1   No orders of the defined types were placed in this encounter.    Discussed warning signs or symptoms. Please see discharge instructions. Patient expresses understanding.   The above documentation has been reviewed and is accurate and complete Lynne Leader, M.D.

## 2021-03-04 ENCOUNTER — Ambulatory Visit (INDEPENDENT_AMBULATORY_CARE_PROVIDER_SITE_OTHER): Payer: Self-pay | Admitting: Family Medicine

## 2021-03-04 ENCOUNTER — Ambulatory Visit: Payer: Self-pay

## 2021-03-04 ENCOUNTER — Other Ambulatory Visit: Payer: Self-pay

## 2021-03-04 VITALS — BP 118/80 | HR 98 | Ht 61.0 in | Wt 180.8 lb

## 2021-03-04 DIAGNOSIS — M25542 Pain in joints of left hand: Secondary | ICD-10-CM

## 2021-03-04 DIAGNOSIS — M79671 Pain in right foot: Secondary | ICD-10-CM

## 2021-03-04 DIAGNOSIS — M25541 Pain in joints of right hand: Secondary | ICD-10-CM

## 2021-03-04 DIAGNOSIS — M65332 Trigger finger, left middle finger: Secondary | ICD-10-CM

## 2021-03-04 DIAGNOSIS — M79641 Pain in right hand: Secondary | ICD-10-CM

## 2021-03-04 DIAGNOSIS — M79672 Pain in left foot: Secondary | ICD-10-CM

## 2021-03-04 DIAGNOSIS — M79642 Pain in left hand: Secondary | ICD-10-CM

## 2021-03-04 NOTE — Patient Instructions (Addendum)
Thank you for coming in today.   Return for xray and labs when you get insurance after Dec 15.  Let me front desk know ahead of time.   Plan for hand PT  [facility] Phone [phone]  Recheck in 6 weeks.   Double bandaid splint.   Please use Voltaren gel (Generic Diclofenac Gel) up to 4x daily for pain as needed.  This is available over-the-counter as both the name brand Voltaren gel and the generic diclofenac gel.

## 2021-03-20 ENCOUNTER — Other Ambulatory Visit: Payer: Self-pay

## 2021-03-20 ENCOUNTER — Encounter: Payer: Self-pay | Admitting: Family Medicine

## 2021-03-20 ENCOUNTER — Ambulatory Visit (INDEPENDENT_AMBULATORY_CARE_PROVIDER_SITE_OTHER): Payer: 59

## 2021-03-20 ENCOUNTER — Other Ambulatory Visit: Payer: Self-pay | Admitting: Physical Therapy

## 2021-03-20 ENCOUNTER — Other Ambulatory Visit (INDEPENDENT_AMBULATORY_CARE_PROVIDER_SITE_OTHER): Payer: 59

## 2021-03-20 DIAGNOSIS — M25542 Pain in joints of left hand: Secondary | ICD-10-CM

## 2021-03-20 DIAGNOSIS — M79642 Pain in left hand: Secondary | ICD-10-CM

## 2021-03-20 DIAGNOSIS — M25541 Pain in joints of right hand: Secondary | ICD-10-CM | POA: Diagnosis not present

## 2021-03-20 DIAGNOSIS — M79671 Pain in right foot: Secondary | ICD-10-CM

## 2021-03-20 DIAGNOSIS — M79641 Pain in right hand: Secondary | ICD-10-CM

## 2021-03-20 DIAGNOSIS — M65332 Trigger finger, left middle finger: Secondary | ICD-10-CM

## 2021-03-20 DIAGNOSIS — M79672 Pain in left foot: Secondary | ICD-10-CM

## 2021-03-20 LAB — CK: Total CK: 86 U/L (ref 7–177)

## 2021-03-20 LAB — SEDIMENTATION RATE: Sed Rate: 31 mm/hr — ABNORMAL HIGH (ref 0–20)

## 2021-03-21 NOTE — Progress Notes (Signed)
Early rheumatologic labs show minimally elevated sedimentation rate and normal CK.  Other labs are still pending.

## 2021-03-21 NOTE — Progress Notes (Signed)
Left hand x-ray looks normal to radiology.

## 2021-03-21 NOTE — Progress Notes (Signed)
Right hand x-ray looks normal to radiology

## 2021-03-24 LAB — CYCLIC CITRUL PEPTIDE ANTIBODY, IGG: Cyclic Citrullin Peptide Ab: <16 UNITS

## 2021-03-24 LAB — ANA: Anti Nuclear Antibody (ANA): POSITIVE — AB

## 2021-03-24 LAB — RHEUMATOID FACTOR: Rheumatoid fact SerPl-aCnc: <14 IU/mL (ref <14)

## 2021-03-24 LAB — HLA-B27 ANTIGEN: HLA-B27 Antigen: NEGATIVE

## 2021-03-24 LAB — ANTI-NUCLEAR AB-TITER (ANA TITER): ANA Titer 1: 1:40 {titer} — ABNORMAL HIGH

## 2021-03-24 LAB — COMPLEMENT, TOTAL: Compl, Total (CH50): >60 U/mL — ABNORMAL HIGH (ref 31–60)

## 2021-03-25 ENCOUNTER — Encounter: Payer: Self-pay | Admitting: Family Medicine

## 2021-03-25 DIAGNOSIS — M25542 Pain in joints of left hand: Secondary | ICD-10-CM

## 2021-03-25 DIAGNOSIS — M79642 Pain in left hand: Secondary | ICD-10-CM

## 2021-03-25 NOTE — Progress Notes (Signed)
Following rheumatologic labs show elevated complement level.  ANA is barely elevated with a titer of 1:40 which usually does not mean much. Your results are a bit of a mixed bag and are not strongly positive necessarily but are probably enough that a rheumatologist may be able to do more tests.    Would you like me to refer you to a rheumatologist?

## 2021-03-27 NOTE — Progress Notes (Signed)
I, Wendy Poet, LAT, ATC, am serving as scribe for Dr. Lynne Leader.  Natasha Horn is a 45 y.o. female who presents to [place] at [facility] today for f/u of B hand pain, L 3rd finger trigger finger and B foot pain.  She was last seen by Dr. Georgina Snell on 03/04/21 and was ultimately referred to rheumatology.  She was also referred to hand therapy at [facility].  Today, pt reports that her hands con't to be painful and she is having triggering in her R pinky and L ring finger.  She states that she is now having some pain and tightness in her forearm, L>R.  She notes a stinging, sharp pain in her forearm when she tries to lift anything.  She has not heard from hand therapy yet for scheduling ([facility])  Additionally she communicated with me in the interim about potential paronychia after a manicure.  She notes that the area around her cuticle at the both thumbs is irritated red and painful.  Diagnostic testing: ANA, sed rate, RF, CK, HLA-B27 labs- 03/20/21; R and L hand XR- 03/20/21  Pertinent review of systems: No fevers or chills  Relevant historical information: GAD.   Exam:  BP 120/80 (BP Location: Right Arm, Patient Position: Sitting, Cuff Size: Normal)    Pulse 77    Ht $R'5\' 1"'qH$  (1.549 m)    Wt 184 lb 6.4 oz (83.6 kg)    LMP 05/04/2020    SpO2 97%    BMI 34.84 kg/m  General: Well Developed, well nourished, and in no acute distress.   MSK:  Bilateral thumbs show linear ridges in the nails.  The cuticle area is slightly raised and erythematous and mildly tender palpation.  No fluctuance is present.  No significant nail pitting is visible across the entire hand.  However there are ridges on the majority of the nails across her hand.      Lab and Radiology Results Recent Results (from the past 2160 hour(s))  HLA-B27 antigen     Status: None   Collection Time: 03/20/21 11:32 AM  Result Value Ref Range   HLA-B27 Antigen NEGATIVE NEGATIVE   Complement, total     Status: Abnormal   Collection Time: 03/20/21 11:32 AM  Result Value Ref Range   Compl, Total (CH50) >60 (H) 31 - 60 U/mL    Comment: Verified by repeat analysis. .   CK     Status: None   Collection Time: 03/20/21 11:32 AM  Result Value Ref Range   Total CK 86 7 - 177 U/L  Rheumatoid factor     Status: None   Collection Time: 03/20/21 11:32 AM  Result Value Ref Range   Rhuematoid fact SerPl-aCnc <14 <14 IU/mL  ANA     Status: Abnormal   Collection Time: 03/20/21 11:32 AM  Result Value Ref Range   Anti Nuclear Antibody (ANA) POSITIVE (A) NEGATIVE    Comment: ANA IFA is a first line screen for detecting the presence of up to approximately 150 autoantibodies in various autoimmune diseases. A positive ANA IFA result is suggestive of autoimmune disease and reflexes to titer and pattern. Further laboratory testing may be considered if clinically indicated. . For additional information, please refer to http://education.[facility].com/faq/FAQ177 (This link is being provided for informational/ educational purposes only.) .   Cyclic citrul peptide antibody, IgG     Status: None   Collection Time: 03/20/21 11:32 AM  Result Value Ref Range  Cyclic Citrullin Peptide Ab <16 UNITS    Comment: Reference Range Negative:            <20 Weak Positive:       20-39 Moderate Positive:   40-59 Strong Positive:     >59 .   Sedimentation rate     Status: Abnormal   Collection Time: 03/20/21 11:32 AM  Result Value Ref Range   Sed Rate 31 (H) 0 - 20 mm/hr  Anti-nuclear ab-titer (ANA titer)     Status: Abnormal   Collection Time: 03/20/21 11:32 AM  Result Value Ref Range   ANA Titer 1 1:40 (H) titer    Comment: A low level ANA titer may be present in pre-clinical autoimmune diseases and normal individuals.                 Reference Range                 <1:40        Negative                 1:40-1:80    Low Antibody Level                 >1:80         Elevated Antibody Level .    ANA Pattern 1 Nuclear, Homogeneous (A)     Comment: Homogeneous pattern is associated with systemic lupus erythematosus (SLE), drug-induced lupus and juvenile idiopathic arthritis. . AC-1: Homogeneous . International Consensus on ANA Patterns (https://www.hernandez-brewer.com/)     DG Hand Complete Left  Result Date: 03/20/2021 CLINICAL DATA:  Pain. EXAM: LEFT HAND - COMPLETE 3+ VIEW COMPARISON:  None. FINDINGS: There is no evidence of fracture or dislocation. There is no evidence of arthropathy or other focal bone abnormality. Soft tissues are unremarkable. IMPRESSION: Negative. Electronically Signed   By: Dorise Bullion III M.D.   On: 03/20/2021 20:41   DG Hand Complete Right  Result Date: 03/20/2021 CLINICAL DATA:  Chronic bilateral pain with multiple trigger fingers. Recent left worsening, radiating pain into the left elbow. No known injury. EXAM: RIGHT HAND - COMPLETE 3+ VIEW COMPARISON:  None. FINDINGS: There is no evidence of fracture or dislocation. There is no evidence of arthropathy or other focal bone abnormality. Soft tissues are unremarkable. IMPRESSION: Negative. Electronically Signed   By: Dorise Bullion III M.D.   On: 03/20/2021 20:39    I, Lynne Leader, personally (independently) visualized and performed the interpretation of the images attached in this note.      Assessment and Plan: 45 y.o. female with  Possible paronychia.  Patient had a manicure and now has some erythema at her cuticles bilateral thumbs.  If this is a paronychia it is quite early.  We discussed options.  She would like to take some antibiotics which I do think is reasonable at this time.  Use doxycycline for 1 week.  .  As for her general arthralgias.  She does have some physical exam signs that are some concerning for psoriatic arthritis with the nail changes and hand soreness.  Her labs do show elevated complement level and a mildly positive ANA titer and sed  rate.  HLA-B27 was negative.  I have referred her to rheumatologist for further assessment.  Additionally she was previously referred to hand PT which I think will be helpful.  Provided her with a phone number to call herself to get it set up as there may be some communication issue between  her and the physical therapy office.  Recheck with me in 6 weeks.   PDMP not reviewed this encounter. No orders of the defined types were placed in this encounter.  Meds ordered this encounter  Medications   doxycycline (VIBRA-TABS) 100 MG tablet    Sig: Take 1 tablet (100 mg total) by mouth 2 (two) times daily for 7 days.    Dispense:  14 tablet    Refill:  0     Discussed warning signs or symptoms. Please see discharge instructions. Patient expresses understanding.   The above documentation has been reviewed and is accurate and complete Lynne Leader, M.D.   Total encounter time 30 minutes including face-to-face time with the patient and, reviewing past medical record, and charting on the date of service.   Lab review treatment plan options and discussion of differential diagnosis.

## 2021-03-28 ENCOUNTER — Encounter: Payer: Self-pay | Admitting: Family Medicine

## 2021-03-28 ENCOUNTER — Ambulatory Visit (INDEPENDENT_AMBULATORY_CARE_PROVIDER_SITE_OTHER): Payer: 59 | Admitting: Family Medicine

## 2021-03-28 ENCOUNTER — Other Ambulatory Visit: Payer: Self-pay

## 2021-03-28 VITALS — BP 120/80 | HR 77 | Ht 61.0 in | Wt 184.4 lb

## 2021-03-28 DIAGNOSIS — L03012 Cellulitis of left finger: Secondary | ICD-10-CM | POA: Diagnosis not present

## 2021-03-28 DIAGNOSIS — M79642 Pain in left hand: Secondary | ICD-10-CM

## 2021-03-28 DIAGNOSIS — M79641 Pain in right hand: Secondary | ICD-10-CM

## 2021-03-28 DIAGNOSIS — M25541 Pain in joints of right hand: Secondary | ICD-10-CM | POA: Diagnosis not present

## 2021-03-28 DIAGNOSIS — L03011 Cellulitis of right finger: Secondary | ICD-10-CM | POA: Diagnosis not present

## 2021-03-28 DIAGNOSIS — M25542 Pain in joints of left hand: Secondary | ICD-10-CM

## 2021-03-28 MED ORDER — DOXYCYCLINE HYCLATE 100 MG PO TABS: 100.0000 mg | ORAL_TABLET | Freq: Two times a day (BID) | ORAL | 0 refills | Status: AC

## 2021-03-28 NOTE — Patient Instructions (Addendum)
Good to see you today.  Please call [facility] at [phone] to schedule hand therapy.  Follow-up: 6 weeks.   Take the doxycycline antibiotic for 1 week.

## 2021-05-09 ENCOUNTER — Ambulatory Visit: Payer: 59 | Admitting: Family Medicine

## 2021-11-05 ENCOUNTER — Encounter (INDEPENDENT_AMBULATORY_CARE_PROVIDER_SITE_OTHER): Payer: Self-pay

## 2023-12-03 IMAGING — DX DG HAND COMPLETE 3+V*R*
3 series · 3 of 3 positions shown · non-contrast
Comparison: None.

CLINICAL DATA: Chronic bilateral pain with multiple trigger
fingers. Recent left worsening, radiating pain into the left elbow.
No known injury.

EXAM:
RIGHT HAND - COMPLETE 3+ VIEW

[hand ap]
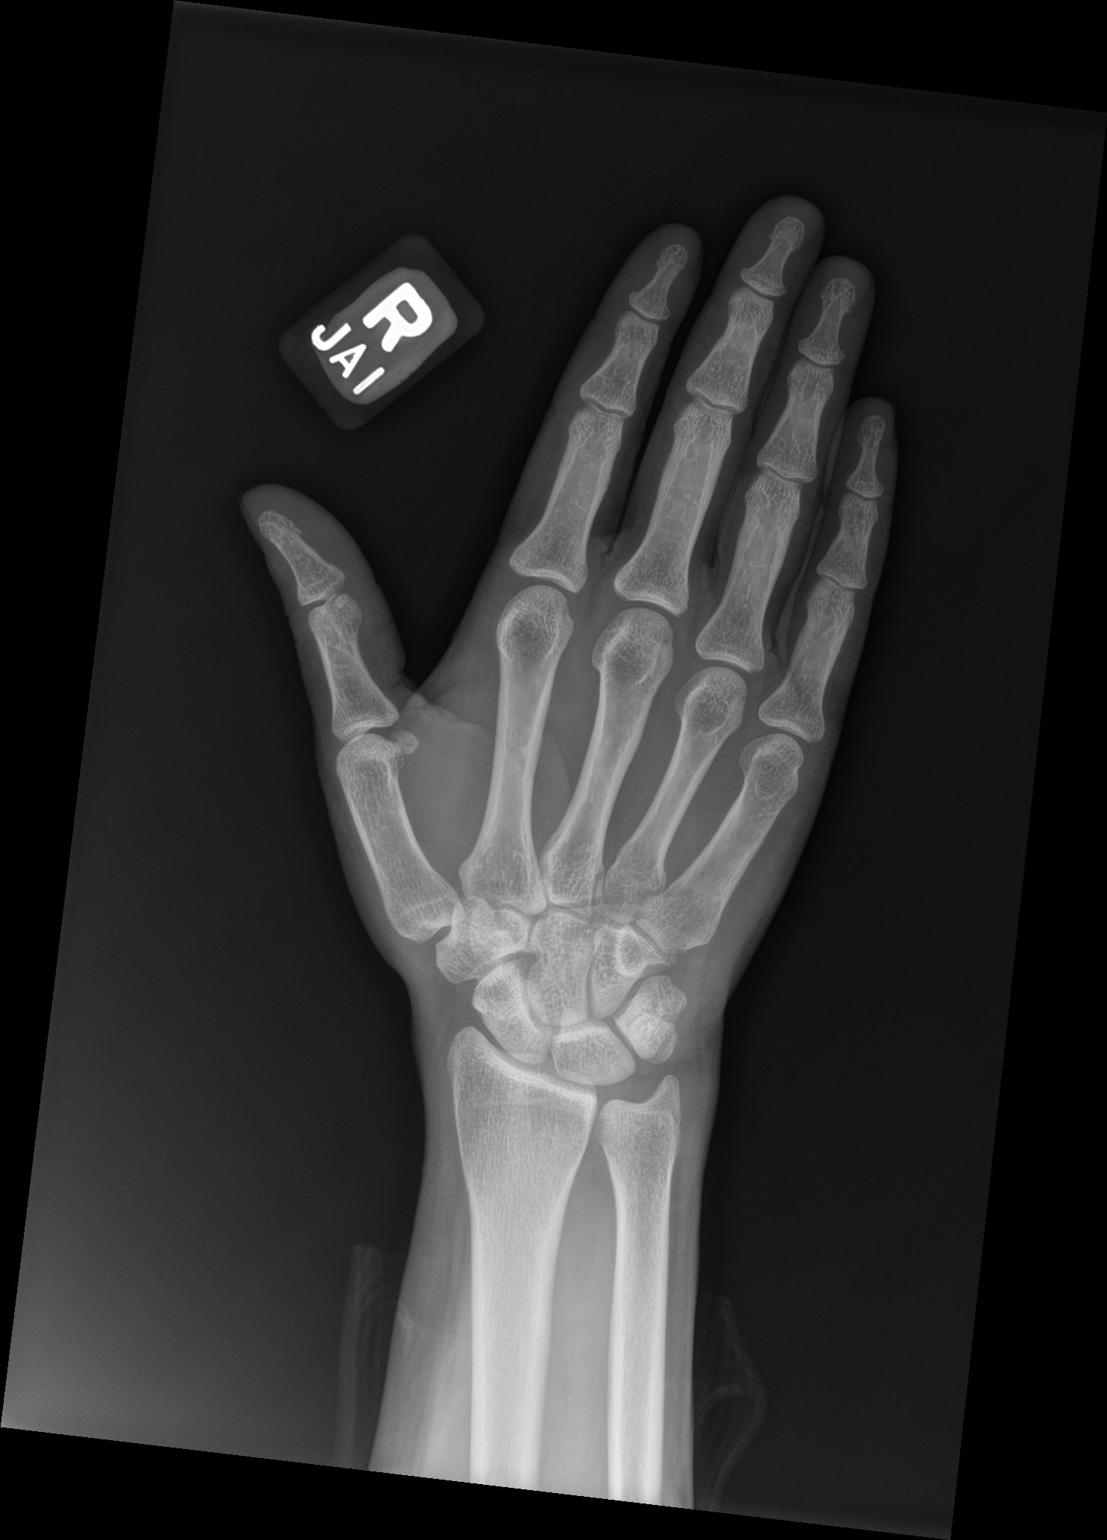

[hand obl]
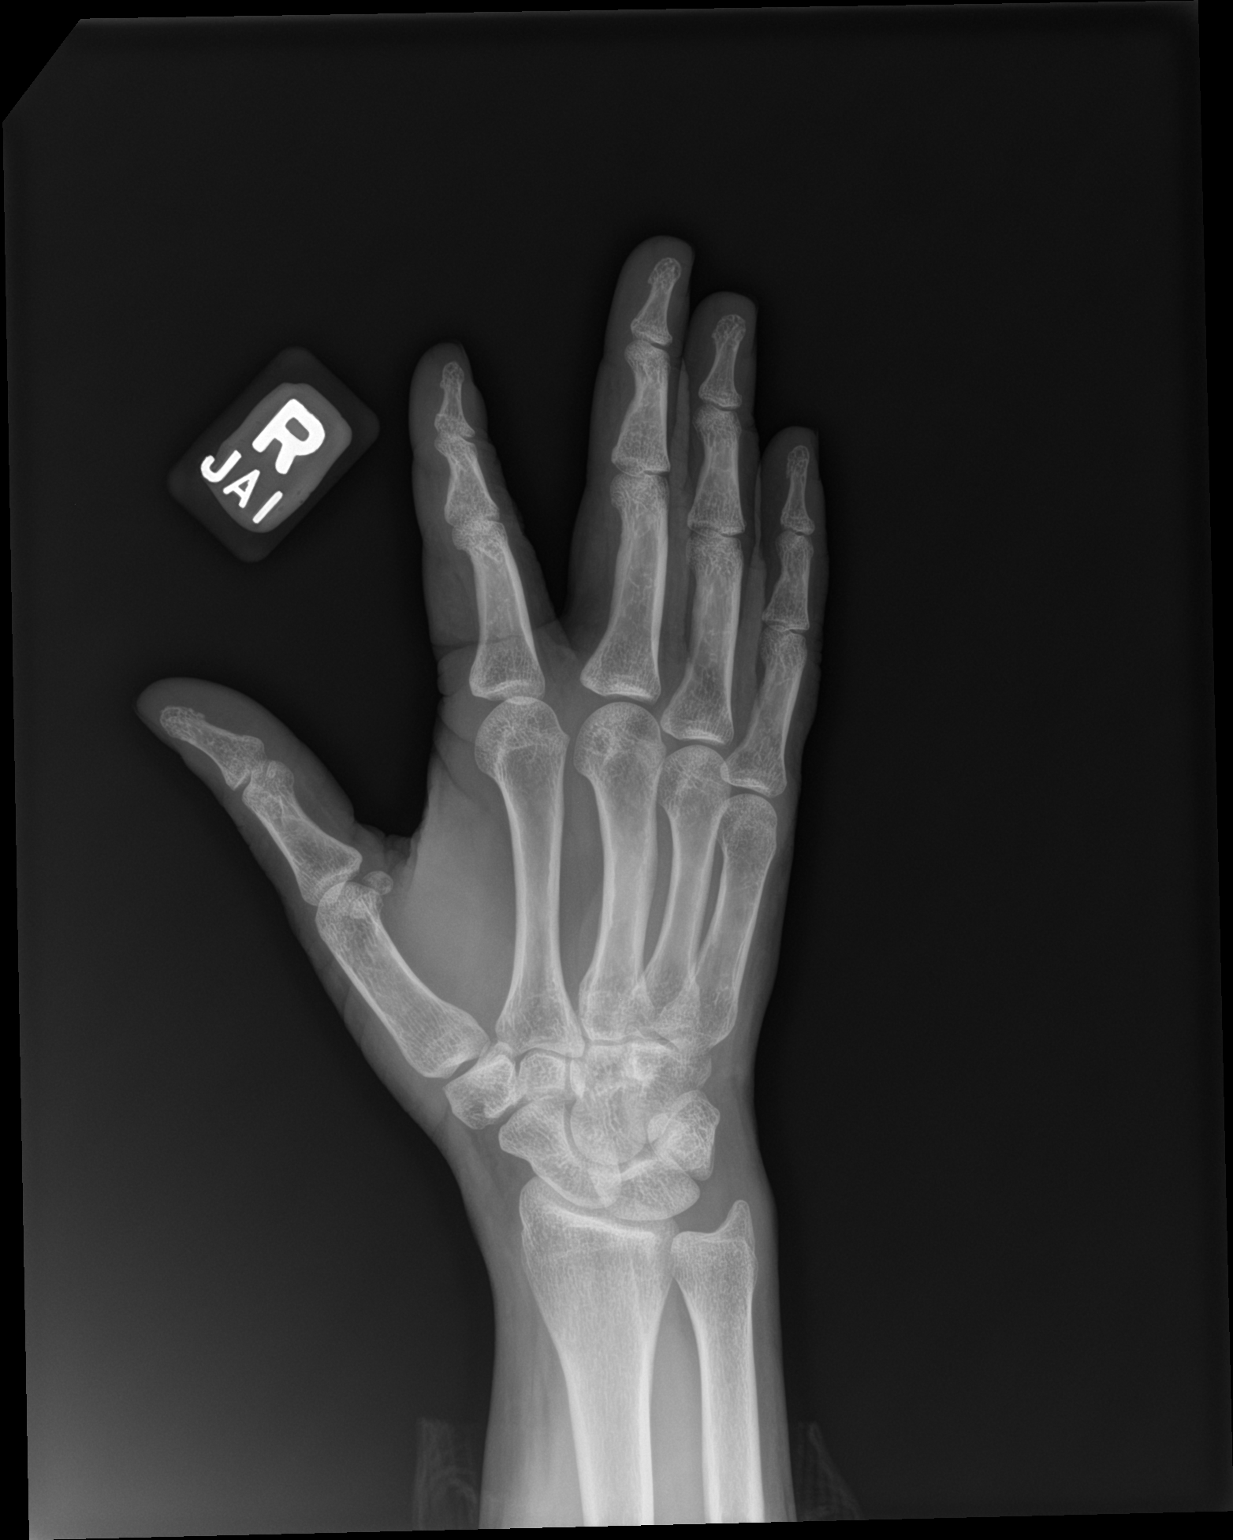

[hand lat]
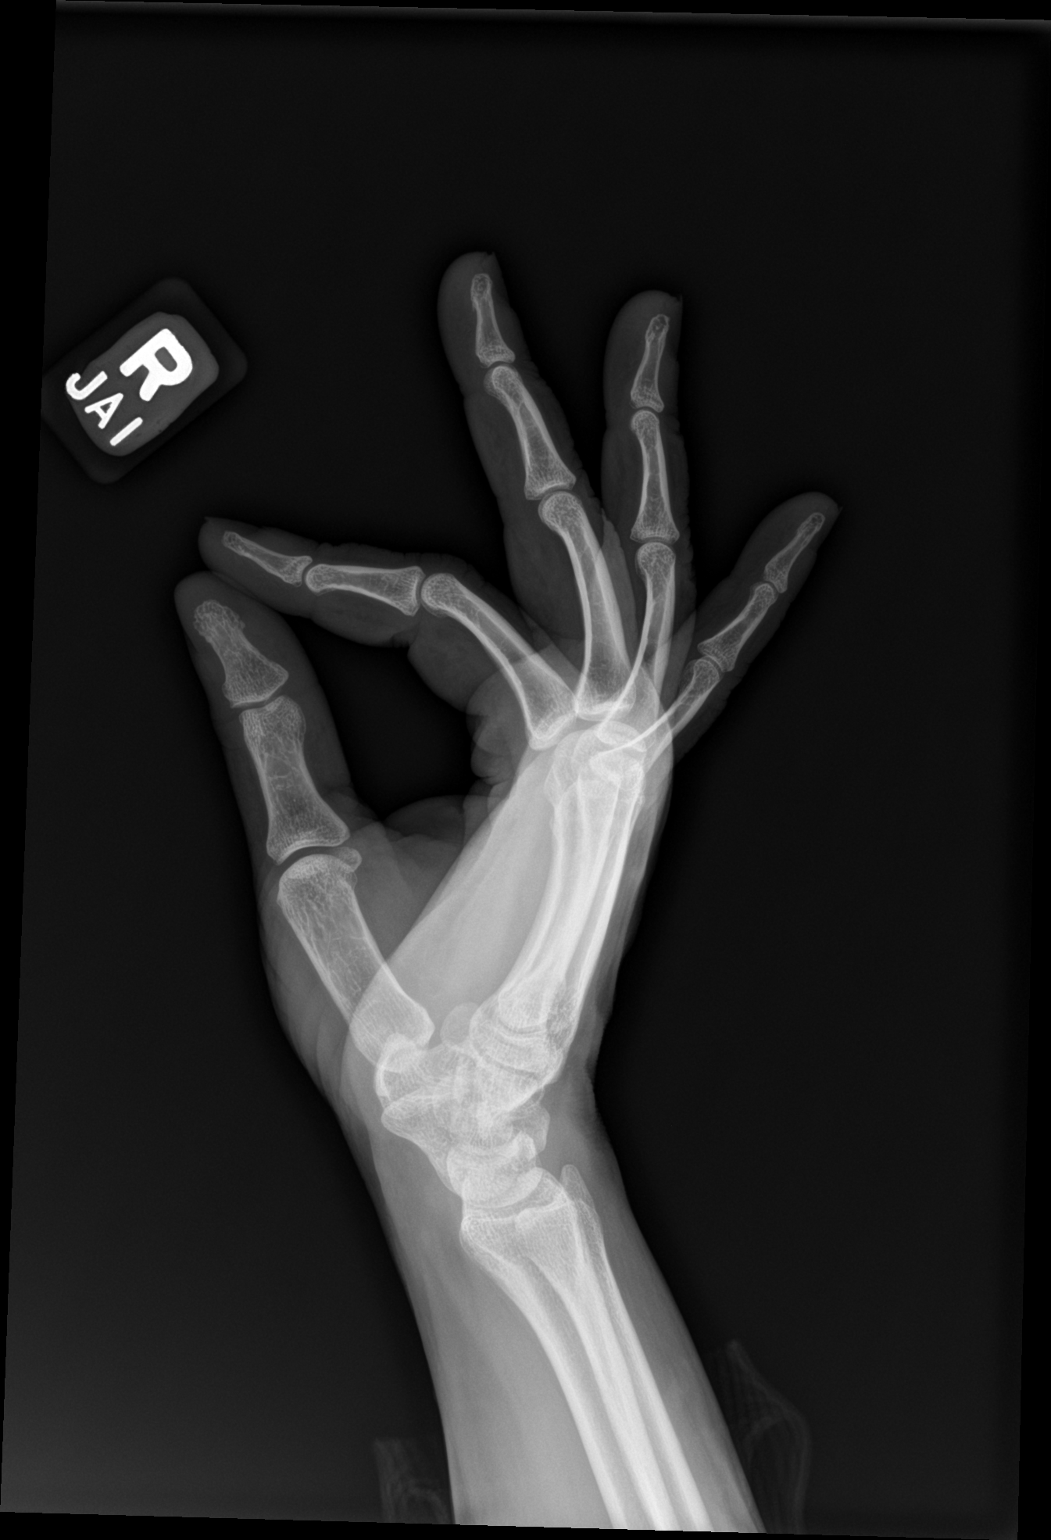

[3 of 3 positions shown; findings below may reference images not displayed]

FINDINGS: There is no evidence of fracture or dislocation. There is no
evidence of arthropathy or other focal bone abnormality. Soft
tissues are unremarkable.
IMPRESSION: Negative.
